# Patient Record
Sex: Female | Born: 1965 | Race: White | Hispanic: No | Marital: Married | Smoking: Never smoker
Health system: Southern US, Community
[De-identification: ages and names within clinical notes are randomized; demographics above are authoritative.]

## PROBLEM LIST (undated history)

## (undated) DIAGNOSIS — F32A Depression, unspecified: Secondary | ICD-10-CM

## (undated) DIAGNOSIS — N3010 Interstitial cystitis (chronic) without hematuria: Secondary | ICD-10-CM

## (undated) DIAGNOSIS — F419 Anxiety disorder, unspecified: Secondary | ICD-10-CM

## (undated) DIAGNOSIS — E785 Hyperlipidemia, unspecified: Secondary | ICD-10-CM

## (undated) DIAGNOSIS — I447 Left bundle-branch block, unspecified: Secondary | ICD-10-CM

## (undated) DIAGNOSIS — S86011A Strain of right Achilles tendon, initial encounter: Secondary | ICD-10-CM

## (undated) DIAGNOSIS — T4145XA Adverse effect of unspecified anesthetic, initial encounter: Secondary | ICD-10-CM

## (undated) DIAGNOSIS — F329 Major depressive disorder, single episode, unspecified: Secondary | ICD-10-CM

## (undated) DIAGNOSIS — T8859XA Other complications of anesthesia, initial encounter: Secondary | ICD-10-CM

## (undated) DIAGNOSIS — T7840XA Allergy, unspecified, initial encounter: Secondary | ICD-10-CM

## (undated) HISTORY — DX: Hyperlipidemia, unspecified: E78.5

## (undated) HISTORY — DX: Allergy, unspecified, initial encounter: T78.40XA

## (undated) HISTORY — DX: Interstitial cystitis (chronic) without hematuria: N30.10

## (undated) HISTORY — PX: TUBAL LIGATION: SHX77

## (undated) HISTORY — PX: BUNIONECTOMY: SHX129

## (undated) HISTORY — DX: Major depressive disorder, single episode, unspecified: F32.9

## (undated) HISTORY — DX: Depression, unspecified: F32.A

## (undated) HISTORY — PX: BREAST SURGERY: SHX581

---

## 2001-10-06 ENCOUNTER — Ambulatory Visit (HOSPITAL_COMMUNITY): Admission: RE | Admit: 2001-10-06 | Discharge: 2001-10-06 | Payer: Self-pay | Admitting: Obstetrics and Gynecology

## 2003-11-13 ENCOUNTER — Other Ambulatory Visit: Admission: RE | Admit: 2003-11-13 | Discharge: 2003-11-13 | Payer: Self-pay | Admitting: Obstetrics and Gynecology

## 2005-06-09 ENCOUNTER — Other Ambulatory Visit: Admission: RE | Admit: 2005-06-09 | Discharge: 2005-06-09 | Payer: Self-pay | Admitting: Obstetrics and Gynecology

## 2006-05-29 ENCOUNTER — Inpatient Hospital Stay (HOSPITAL_COMMUNITY): Admission: EM | Admit: 2006-05-29 | Discharge: 2006-05-30 | Payer: Self-pay | Admitting: Emergency Medicine

## 2006-09-21 ENCOUNTER — Ambulatory Visit (HOSPITAL_COMMUNITY): Admission: RE | Admit: 2006-09-21 | Discharge: 2006-09-21 | Payer: Self-pay | Admitting: Family Medicine

## 2010-06-18 ENCOUNTER — Ambulatory Visit
Admission: RE | Admit: 2010-06-18 | Discharge: 2010-06-18 | Payer: Self-pay | Source: Home / Self Care | Attending: Family Medicine | Admitting: Family Medicine

## 2010-06-19 ENCOUNTER — Ambulatory Visit (HOSPITAL_COMMUNITY)
Admission: RE | Admit: 2010-06-19 | Discharge: 2010-06-19 | Payer: Self-pay | Source: Home / Self Care | Attending: Family Medicine | Admitting: Family Medicine

## 2010-07-14 ENCOUNTER — Ambulatory Visit: Admit: 2010-07-14 | Payer: Self-pay | Admitting: Internal Medicine

## 2010-08-18 ENCOUNTER — Institutional Professional Consult (permissible substitution): Payer: Self-pay | Admitting: Internal Medicine

## 2010-11-13 NOTE — Op Note (Signed)
[facility]  Patient:    DLISA, BARNWELL Visit Number: [id number] MRN: [id number]          Service Type: DSU Location: [facility] Attending Physician:  Frederich Balding Dictated by:   Juluis Mire, M.D. Proc. Date: 10/06/01 Admit Date:  10/06/2001 Discharge Date: 10/06/2001                             Operative Report  PREOPERATIVE DIAGNOSES:       Multiparity, desires sterility.  POSTOPERATIVE DIAGNOSES:      Multiparity, desires sterility.  OPERATIVE PROCEDURE:          Laparoscopic bilateral tubal fulguration.  SURGEON:                      Juluis Mire, M.D.  ANESTHESIA:                   General.  ESTIMATED BLOOD LOSS:         Minimal.  PACKS AND DRAINS:             None.  INTRAOPERATIVE BLOOD PLACED:  None.  COMPLICATIONS:                None.  INDICATIONS:                  As dictated in the history and physical.  PROCEDURE AS FOLLOWS:         Patient taken to the OR and placed in supine position.  After a satisfactory level of general endotracheal anesthesia was obtained patient was placed in the dorsal lithotomy position using the Allen stirrups.  The abdomen, perineum, and vagina were prepped out with Betadine. Bladder was emptied by in-and-out catheterization.  A Hulka tenaculum was put in place and secured.  The patient was draped out for laparoscopy.  A subumbilical incision was made with the knife.  Veress needle was introduced in the abdominal cavity.  Abdomen was inflated with approximately 3 L of carbon dioxide.  Operating laparoscope was introduced.  There was no evidence of injury to adjacent organs.  Uterus was normal size and shape.  Tubes and ovaries were unremarkable.  There were no signs of endometriosis or adhesive processes.  The appendix was visualized and noted to be normal.  [place] abdomen including liver and tip of the gallbladder were clear.  Each tube was adequately identified.  Using the bipolar a 2 cm  segment of each tube was coagulated.  Coagulation was continued until resistance read 0 on the meter. Same segment of tube was then recoagulated completely desiccating the tube. Coagulation did extend out to the mesosalpinx.  At the end of the procedure both tubes were adequately coagulated.  There were no signs of injury to adjacent organs.  The abdomen was deflated of its carbon dioxide and the laparoscope was removed.  Subumbilical incision was closed with interrupted subcuticulars of 4-0 Vicryl.  The Hulka tenaculum was then removed.  Patient taken out of the dorsal lithotomy position and once extubated transferred to recovery room in good condition.  Sponge, instrument, needle count reported as correct by circulating nurse x2. Dictated by:   Juluis Mire, M.D. Attending Physician:  Frederich Balding DD:  10/06/01 TD:  10/06/01 Job: [id number]

## 2010-11-13 NOTE — H&P (Signed)
[facility]  Patient:    Deanna Simpson, Deanna Simpson Visit Number: [id number] MRN: [id number]          Service Type: DSU Location: [facility] Attending Physician:  Frederich Balding Dictated by:   Juluis Mire, M.D. Admit Date:  10/06/2001 Discharge Date: 10/06/2001                           History and Physical  CHIEF COMPLAINT:              The patient is a 45 year old gravida 4 para 3 abortus 1 married white female, who presents for permanent sterilization in the form of bilateral tubal ligation.  HISTORY OF PRESENT ILLNESS:   Alternative forms of birth control have been discussed.  The potential irreversibility of sterilization was explained.  A failure rate of 1:200 was quoted.  Pregnancy can be in the form of ectopic pregnancy requiring further surgical management.  The patient expressed understanding of the indications and risks.  ALLERGIES:                    1. ANTIHISTAMINES.                               2. CLARITIN.  MEDICATIONS:                  None.  PAST MEDICAL HISTORY:         Usual childhood diseases, no significant sequelae.  PAST SURGICAL HISTORY:        Only surgical history is a bunionectomy.  PAST OBSTETRICAL HISTORY:     1. She has had three spontaneous vaginal                                  deliveries.                               2. One spontaneous abortion.  FAMILY HISTORY:               Noncontributory.  SOCIAL HISTORY:               No tobacco or alcohol use.  REVIEW OF SYSTEMS:            Noncontributory.  PHYSICAL EXAMINATION:  VITAL SIGNS:                  Afebrile, stable vital signs.  HEENT:                        Normocephalic.  PERRLA.  EOMI.  Sclerae and conjunctivae clear.  Oropharynx clear.  NECK:                         Without thyromegaly.  BREAST:                       Not examined.  LUNGS:                        Clear.  CARDIAC:                      Regular rhythm and rate without  murmurs  or gallops.  ABDOMEN:                      Benign.  PELVIC:                       Normal external genitalia.  Vaginal mucosa clear.  Cervix unremarkable.  Uterus normal size, shape, and contour.  Adnexa free of masses or tenderness.  EXTREMITIES:                  Trace edema.  NEUROLOGIC:                   Grossly within normal limits.  IMPRESSION:                   Multiparity, desires sterility.  PLAN:                         The patient will undergo laparoscopic bilateral tubal ligation.  The risks of surgery have been discussed including the risk of infection, the risk of vascular injury that could lead to hemorrhage requiring transfusion and possible exploratory surgery, risk of injury to adjacent organs such as bowel or bladder that could require further exploratory surgery, the risk of deep vein thrombosis and pulmonary embolus. The patient expressed understanding of indications and risks and is accepting of them. Dictated by:   Juluis Mire, M.D. Attending Physician:  Frederich Balding DD:  10/06/01 TD:  10/06/01 Job: [id number]
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
## 2011-06-03 ENCOUNTER — Ambulatory Visit (INDEPENDENT_AMBULATORY_CARE_PROVIDER_SITE_OTHER): Payer: BC Managed Care – PPO | Admitting: Family Medicine

## 2011-06-03 DIAGNOSIS — IMO0001 Reserved for inherently not codable concepts without codable children: Secondary | ICD-10-CM

## 2011-06-03 DIAGNOSIS — R5381 Other malaise: Secondary | ICD-10-CM

## 2011-06-03 DIAGNOSIS — G4700 Insomnia, unspecified: Secondary | ICD-10-CM

## 2011-08-10 ENCOUNTER — Ambulatory Visit (INDEPENDENT_AMBULATORY_CARE_PROVIDER_SITE_OTHER): Payer: BC Managed Care – PPO | Admitting: Family Medicine

## 2011-08-10 ENCOUNTER — Encounter: Payer: Self-pay | Admitting: Family Medicine

## 2011-08-10 ENCOUNTER — Ambulatory Visit: Payer: BC Managed Care – PPO

## 2011-08-10 VITALS — BP 126/81 | HR 76 | Temp 98.3°F | Resp 16 | Ht 63.75 in | Wt 205.0 lb

## 2011-08-10 DIAGNOSIS — M797 Fibromyalgia: Secondary | ICD-10-CM | POA: Insufficient documentation

## 2011-08-10 DIAGNOSIS — M25552 Pain in left hip: Secondary | ICD-10-CM

## 2011-08-10 DIAGNOSIS — M25559 Pain in unspecified hip: Secondary | ICD-10-CM

## 2011-08-10 DIAGNOSIS — F329 Major depressive disorder, single episode, unspecified: Secondary | ICD-10-CM | POA: Insufficient documentation

## 2011-08-10 DIAGNOSIS — F32A Depression, unspecified: Secondary | ICD-10-CM | POA: Insufficient documentation

## 2011-08-10 DIAGNOSIS — E785 Hyperlipidemia, unspecified: Secondary | ICD-10-CM | POA: Insufficient documentation

## 2011-08-10 DIAGNOSIS — M25551 Pain in right hip: Secondary | ICD-10-CM

## 2011-08-10 DIAGNOSIS — E669 Obesity, unspecified: Secondary | ICD-10-CM

## 2011-08-10 MED ORDER — METAXALONE 800 MG PO TABS: 800.0000 mg | ORAL_TABLET | Freq: Three times a day (TID) | ORAL | Status: AC

## 2011-08-10 NOTE — Progress Notes (Signed)
  Subjective:    Patient ID: Deanna Simpson, female    DOB: [birth year], 46 y.o.   MRN: [id number]  HPI   This 46 y.o. Cauc female was seen 06/03/11 for evaluation of fatigue and pain of 6 months duration.  Pain was located around her deltoids and shoulders as well as proximal and inner thighs and buttocks.  At that time, her exam was consistent with Fibromyalgia as she had multiple triggers points.Her other  symptoms which included insomnia and fatigue could be attributed to Lamictal which she takes for  Depression (side effect profile includes GI upset, Insomnia, dizziness, back pain and fatigue).  She was referred to PT when xrays showed no significant cause for the pain; C-spine film revealed loss of  lordosis. She was also advised  that a Sleep Study may be helpful.      She states today that she is much better after Physical therapy especially through the back area;  however, she still has a lot of pain and tightness in her hip flexors and this makes intimacy with he   husband difficult. Her therapist did try some treatments aimed at loosening the flexors and she says this   was helpful.       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  Review of Systems  Positive for tightness in the groin area bilaterally.                                   Negative for change in GU/GI function. No Lumbar spine pain.     Objective:   Physical Exam  Vitals reviewed. Constitutional: She is oriented to person, place, and time. She appears well-developed and well-nourished. No distress.  HENT:  Head: Normocephalic and atraumatic.  Neck: Normal range of motion. Neck supple.  Cardiovascular: Normal rate.   Pulmonary/Chest: Effort normal.  Abdominal: Soft. Bowel sounds are normal. She exhibits no distension and no mass. There is no tenderness. There is no guarding.  Musculoskeletal: She exhibits no tenderness.       Bilateral Hip Flexors: decreased ROM with abduction  limited to 30 degrees.  Neurological: She is alert and oriented to person, place, and time.  Skin: Skin is warm and dry.  Psychiatric: She has a normal mood and affect. Her behavior is normal.    [facility] reading (PRIMARY) by  Dr Audria Nine: Normal pelvic/ hip bones without fracture or dislocation.       Assessment & Plan:   1. Hip pain, bilateral  DG Hip Bilateral W/Pelvis- Negative. Trial Skelaxin 800 mg 1/2 - 1 tablet 3x daily prn Continue with Physical Therapy for hip flexor release. Pt has done Yoga in the past and return to this practice could be helpful.  2. Muscle pain, fibromyalgia  Conitnue Aleve prn along with Skelaxin.  3. Obesity  Pt has lost > 10 lbs; encouraged continued wt reduction.

## 2011-08-10 NOTE — Patient Instructions (Signed)
Hip Pain The hips join the [place] legs to the lower pelvis. The bones, cartilage, tendons, and muscles of the hip joint perform a lot of work each day holding your body weight and allowing you to move around. Hip pain is a common symptom. It can range from a minor ache to severe pain on 1 or both hips. Pain may be felt on the inside of the hip joint near the groin, or the outside near the buttocks and [place] thigh. There may be swelling or stiffness as well. It occurs more often when a person walks or performs activity. There are many reasons hip pain can develop. CAUSES  It is important to work with your caregiver to identify the cause since many conditions can impact the bones, cartilage, muscles, and tendons of the hips. Causes for hip pain include:  Broken (fractured) bones.   Separation of the thighbone from the hip socket (dislocation).   Torn cartilage of the hip joint.   Swelling (inflammation) of a tendon (tendonitis), the sac within the hip joint (bursitis), or a joint.   A weakening in the abdominal wall (hernia), affecting the nerves to the hip.   Arthritis in the hip joint or lining of the hip joint.   Pinched nerves in the back, hip, or [place] thigh.   A bulging disc in the spine (herniated disc).   Rarely, bone infection or cancer.  DIAGNOSIS  The location of your hip pain will help your caregiver understand what may be causing the pain. A diagnosis is based on your medical history, your symptoms, results from your physical exam, and results from diagnostic tests. Diagnostic tests may include X-ray exams, a computerized magnetic scan (magnetic resonance imaging, MRI), or bone scan. TREATMENT  Treatment will depend on the cause of your hip pain. Treatment may include:  Limiting activities and resting until symptoms improve.   Crutches or other walking supports (a cane or brace).   Ice, elevation, and compression.   Physical therapy or home exercises.   Shoe inserts or  special shoes.   Losing weight.   Medications to reduce pain.   Undergoing surgery.  HOME CARE INSTRUCTIONS   Only take over-the-counter or prescription medicines for pain, discomfort, or fever as directed by your caregiver.   Put ice on the injured area:   Put ice in a plastic bag.   Place a towel between your skin and the bag.   Leave the ice on for 15 to 20 minutes at a time, 3 to 4 times a day.   Keep your leg raised (elevated) when possible to lessen swelling.   Avoid activities that cause pain.   Follow specific exercises as directed by your caregiver.   Sleep with a pillow between your legs on your most comfortable side.   Record how often you have hip pain, the location of the pain, and what it feels like. This information may be helpful to you and your caregiver.   Ask your caregiver about returning to work or sports and whether you should drive.   Follow up with your caregiver for further exams, therapy, or testing as directed.  SEEK MEDICAL CARE IF:   Your pain or swelling continues or worsens after 1 week.   You are feeling unwell or have chills.   You have increasing difficulty with walking.   You have a loss of sensation or other new symptoms.   You have questions or concerns.  SEEK IMMEDIATE MEDICAL CARE IF:   You   cannot put weight on the affected hip.   You have fallen.   You have a sudden increase in pain and swelling in your hip.   You have a fever.  MAKE SURE YOU:   Understand these instructions.   Will watch your condition.   Will get help right away if you are not doing well or get worse.  Document Released: 12/02/2009 Document Revised: 02/24/2011 Document Reviewed: 12/02/2009 ExitCare Patient Information 2012 [employer]. 

## 2012-08-28 ENCOUNTER — Telehealth: Payer: Self-pay

## 2012-08-28 ENCOUNTER — Ambulatory Visit: Payer: BC Managed Care – PPO

## 2012-08-28 ENCOUNTER — Ambulatory Visit: Payer: BC Managed Care – PPO | Admitting: Emergency Medicine

## 2012-08-28 VITALS — BP 124/82 | HR 87 | Temp 97.9°F | Resp 16 | Ht 63.5 in | Wt 215.0 lb

## 2012-08-28 DIAGNOSIS — R110 Nausea: Secondary | ICD-10-CM

## 2012-08-28 DIAGNOSIS — R109 Unspecified abdominal pain: Secondary | ICD-10-CM

## 2012-08-28 DIAGNOSIS — R1084 Generalized abdominal pain: Secondary | ICD-10-CM

## 2012-08-28 DIAGNOSIS — K2990 Gastroduodenitis, unspecified, without bleeding: Secondary | ICD-10-CM

## 2012-08-28 DIAGNOSIS — K2970 Gastritis, unspecified, without bleeding: Secondary | ICD-10-CM

## 2012-08-28 LAB — POCT URINALYSIS DIPSTICK
Glucose, UA: NEGATIVE
Protein, UA: NEGATIVE
Urobilinogen, UA: 0.2
pH, UA: 5.5

## 2012-08-28 LAB — POCT CBC
Granulocyte percent: 73.2 %G (ref 37–80)
Lymph, poc: 1.3 (ref 0.6–3.4)
MCHC: 31 g/dL — AB (ref 31.8–35.4)
MID (cbc): 0.4 (ref 0–0.9)
POC Granulocyte: 4.8 (ref 2–6.9)
POC MID %: 6.6 %M (ref 0–12)
WBC: 6.6 10*3/uL (ref 4.6–10.2)

## 2012-08-28 LAB — POCT UA - MICROSCOPIC ONLY
Casts, Ur, LPF, POC: NEGATIVE
Crystals, Ur, HPF, POC: NEGATIVE

## 2012-08-28 LAB — COMPREHENSIVE METABOLIC PANEL
AST: 13 U/L (ref 0–37)
BUN: 11 mg/dL (ref 6–23)
Calcium: 9.7 mg/dL (ref 8.4–10.5)
Creat: 0.79 mg/dL (ref 0.50–1.10)
Glucose, Bld: 98 mg/dL (ref 70–99)
Potassium: 4.4 mEq/L (ref 3.5–5.3)
Sodium: 141 mEq/L (ref 135–145)
Total Bilirubin: 0.3 mg/dL (ref 0.3–1.2)

## 2012-08-28 LAB — LIPASE: Lipase: 13 U/L (ref 0–75)

## 2012-08-28 NOTE — Telephone Encounter (Signed)
PT WOULD LIKE TO SPEAK WITH A NURSE REGARDING PAIN IN HER STOMACH AS THOUGH SHE MAY HAVE GALL STONES. I ADVISED PT TO COME IN AND BE SEEN. STATES IF SHE HASN'T HAD A CALL BY 2:00, SHE MAY JUST COME IN. PLEASE CALL [phone]

## 2012-08-28 NOTE — Patient Instructions (Addendum)
Gastritis, Adult Gastritis is soreness and swelling (inflammation) of the lining of the stomach. Gastritis can develop as a sudden onset (acute) or long-term (chronic) condition. If gastritis is not treated, it can lead to stomach bleeding and ulcers. CAUSES  Gastritis occurs when the stomach lining is weak or damaged. Digestive juices from the stomach then inflame the weakened stomach lining. The stomach lining may be weak or damaged due to viral or bacterial infections. One common bacterial infection is the Helicobacter pylori infection. Gastritis can also result from excessive alcohol consumption, taking certain medicines, or having too much acid in the stomach.  SYMPTOMS  In some cases, there are no symptoms. When symptoms are present, they may include:  Pain or a burning sensation in the [place] abdomen.  Nausea.  Vomiting.  An uncomfortable feeling of fullness after eating. DIAGNOSIS  Your caregiver may suspect you have gastritis based on your symptoms and a physical exam. To determine the cause of your gastritis, your caregiver may perform the following:  Blood or stool tests to check for the H pylori bacterium.  Gastroscopy. A thin, flexible tube (endoscope) is passed down the esophagus and into the stomach. The endoscope has a light and camera on the end. Your caregiver uses the endoscope to view the inside of the stomach.  Taking a tissue sample (biopsy) from the stomach to examine under a microscope. TREATMENT  Depending on the cause of your gastritis, medicines may be prescribed. If you have a bacterial infection, such as an H pylori infection, antibiotics may be given. If your gastritis is caused by too much acid in the stomach, H2 blockers or antacids may be given. Your caregiver may recommend that you stop taking aspirin, ibuprofen, or other nonsteroidal anti-inflammatory drugs (NSAIDs). HOME CARE INSTRUCTIONS  Only take over-the-counter or prescription medicines as directed by  your caregiver.  If you were given antibiotic medicines, take them as directed. Finish them even if you start to feel better.  Drink enough fluids to keep your urine clear or pale yellow.  Avoid foods and drinks that make your symptoms worse, such as:  Caffeine or alcoholic drinks.  Chocolate.  Peppermint or mint flavorings.  Garlic and onions.  Spicy foods.  Citrus fruits, such as oranges, lemons, or limes.  Tomato-based foods such as sauce, chili, salsa, and pizza.  Fried and fatty foods.  Eat small, frequent meals instead of large meals. SEEK IMMEDIATE MEDICAL CARE IF:   You have black or dark red stools.  You vomit blood or material that looks like coffee grounds.  You are unable to keep fluids down.  Your abdominal pain gets worse.  You have a fever.  You do not feel better after 1 week.  You have any other questions or concerns. MAKE SURE YOU:  Understand these instructions.  Will watch your condition.  Will get help right away if you are not doing well or get worse. Document Released: 06/08/2001 Document Revised: 12/14/2011 Document Reviewed: 07/28/2011 ExitCare Patient Information 2013 [employer].  

## 2012-08-28 NOTE — Progress Notes (Signed)
[facility] [address], [place] [address] [phone]  Date:  08/28/2012   Name:  Deanna Simpson   DOB:  05/07/1966   MRN:  [id number]  PCP:  No primary provider on file.    Chief Complaint: Abdominal Pain   History of Present Illness:  Deanna Simpson is a 47 y.o. very pleasant female patient who presents with the following:  Ill this morning after eating [place] food last night.  Non of her dining party are ill.  She developed sudden Tremain abdominal sharp pain around 0900 this morning.  No nausea or vomiting.  No distension or bloating.  No improvement with tums.  Denies frequent heart burn or indigestion.  No history of abdominal surgery.  No history of GERD.  No stool change.  No GI or GU or GYN symptoms.  No fever or chills. No improvement with over the counter medications or other home remedies. No excess caffeine, alcohol, ASA, NSAID.  Patient Active Problem List  Diagnosis  . Depression  . Muscle pain, fibromyalgia  . Hyperlipidemia  . Obesity    Past Medical History  Diagnosis Date  . Allergy     Past Surgical History  Procedure Laterality Date  . Breast surgery    . Tubal ligation      History  Substance Use Topics  . Smoking status: Never Smoker   . Smokeless tobacco: Not on file  . Alcohol Use: No    Family History  Problem Relation Age of Onset  . Heart disease Mother     Mitral valve prolapse  . Hyperlipidemia Father   . Heart disease Brother   . Heart disease Maternal Grandmother   . Heart disease Maternal Grandfather     Allergies  Allergen Reactions  . Antihistamines, Loratadine-Type     Heart races.  . Influenza Vaccine Live Swelling    Vaccine causes swelling and pain at injection site  . Prednisone Other (See Comments)    Prednisone makes pt Suicidal/ psychotic.    Medication list has been reviewed and updated.  Current Outpatient Prescriptions on File Prior to Visit  Medication Sig Dispense Refill  .  ARIPiprazole (ABILIFY) 2 MG tablet Take 2 mg by mouth daily.      Marland Kitchen lamoTRIgine (LAMICTAL) 200 MG tablet Take 200 mg by mouth daily.       No current facility-administered medications on file prior to visit.    Review of Systems:  As per HPI, otherwise negative.    Physical Examination: Filed Vitals:   08/28/12 1139  BP: 124/82  Pulse: 87  Temp: 97.9 F (36.6 C)  Resp: 16   Filed Vitals:   08/28/12 1139  Height: 5' 3.5" (1.613 m)  Weight: 215 lb (97.523 kg)   Body mass index is 37.48 kg/(m^2). Ideal Body Weight: Weight in (lb) to have BMI = 25: 143.1  GEN: WDWN, NAD, Non-toxic, A & O x 3 HEENT: Atraumatic, Normocephalic. Neck supple. No masses, No LAD. Ears and Nose: No external deformity. CV: RRR, No M/G/R. No JVD. No thrill. No extra heart sounds. PULM: CTA B, no wheezes, crackles, rhonchi. No retractions. No resp. distress. No accessory muscle use. ABD: S, generalized mild abdominal tenderness, ND, +BS. No rebound. No HSM. EXTR: No c/c/e NEURO Normal gait.  PSYCH: Normally interactive. Conversant. Not depressed or anxious appearing.  Calm demeanor.    Assessment and Plan: Gastritis Improved with GI cocktail. Symptoms relieved prilosec  Carmelina Dane, MD  Results for orders placed in visit on 08/28/12  POCT CBC      Result Value Range   WBC 6.6  4.6 - 10.2 K/uL   Lymph, poc 1.3  0.6 - 3.4   POC LYMPH PERCENT 20.2  10 - 50 %L   MID (cbc) 0.4  0 - 0.9   POC MID % 6.6  0 - 12 %M   POC Granulocyte 4.8  2 - 6.9   Granulocyte percent 73.2  37 - 80 %G   RBC 4.57  4.04 - 5.48 M/uL   Hemoglobin 12.6  12.2 - 16.2 g/dL   HCT, POC 16.1  09.6 - 47.9 %   MCV 89.1  80 - 97 fL   MCH, POC 27.6  27 - 31.2 pg   MCHC 31.0 (*) 31.8 - 35.4 g/dL   RDW, POC 04.5     Platelet Count, POC 278  142 - 424 K/uL   MPV 8.1  0 - 99.8 fL  POCT UA - MICROSCOPIC ONLY      Result Value Range   WBC, Ur, HPF, POC 2-3     RBC, urine, microscopic 0-2     Bacteria, U Microscopic  trace     Mucus, UA trace     Epithelial cells, urine per micros 1-3     Crystals, Ur, HPF, POC neg     Casts, Ur, LPF, POC neg     Yeast, UA neg    POCT URINALYSIS DIPSTICK      Result Value Range   Color, UA yellow     Clarity, UA clear     Glucose, UA neg     Bilirubin, UA neg     Ketones, UA neg     Spec Grav, UA 1.025     Blood, UA neg     pH, UA 5.5     Protein, UA neg     Urobilinogen, UA 0.2     Nitrite, UA neg     Leukocytes, UA Negative      [facility] reading (PRIMARY) by  Dr. Dareen Piano.  Negative for obstruction, SBE, free air.Marland Kitchen

## 2012-08-29 NOTE — Telephone Encounter (Signed)
She was here to be seen yesterday

## 2012-08-30 ENCOUNTER — Encounter: Payer: Self-pay | Admitting: *Deleted

## 2013-02-05 ENCOUNTER — Ambulatory Visit: Payer: BC Managed Care – PPO | Admitting: Family Medicine

## 2013-02-05 VITALS — BP 124/76 | HR 84 | Temp 98.0°F | Resp 17 | Ht 64.0 in | Wt 198.0 lb

## 2013-02-05 DIAGNOSIS — L237 Allergic contact dermatitis due to plants, except food: Secondary | ICD-10-CM

## 2013-02-05 DIAGNOSIS — L255 Unspecified contact dermatitis due to plants, except food: Secondary | ICD-10-CM

## 2013-02-05 MED ORDER — TRIAMCINOLONE ACETONIDE 0.1 % EX CREA: TOPICAL_CREAM | Freq: Three times a day (TID) | CUTANEOUS | Status: DC

## 2013-02-05 MED ORDER — METHYLPREDNISOLONE ACETATE 80 MG/ML IJ SUSP
80.0000 mg | Freq: Once | INTRAMUSCULAR | Status: AC
  Administered 2013-02-05: 80 mg via INTRAMUSCULAR

## 2013-02-05 NOTE — Progress Notes (Signed)
  Subjective:    Patient ID: Deanna Simpson, female    DOB: 1965-10-01, 47 y.o.   MRN: [id number] Chief Complaint  Patient presents with  . Poison Ivy    all over body     HPI   Was [place] last Tues and was in Systems analyst and sports bra. The following day she began itching.  She has been using a rx topical hemorroid cream for the itch as well as calamine but continues to be very itchy and getting worse.  No poison ivy on face, eyes, mouth, or genital area.  Past Medical History  Diagnosis Date  . Allergy   . Depression   . Complication of anesthesia     Histerics   Current Outpatient Prescriptions on File Prior to Visit  Medication Sig Dispense Refill  . cholecalciferol (VITAMIN D) 1000 UNITS tablet Take 1,000 Units by mouth daily.      Marland Kitchen lamoTRIgine (LAMICTAL) 200 MG tablet Take 200 mg by mouth daily.       No current facility-administered medications on file prior to visit.   Allergies  Allergen Reactions  . Antihistamines, Loratadine-Type     Heart races.  . Influenza Vaccine Live Swelling    Vaccine causes swelling and pain at injection site  . Pneumococcal Vaccines Swelling    Causes swelling and pain at injection site.  . Prednisone Other (See Comments)    Prednisone makes pt Suicidal/ psychotic.    Review of Systems  Constitutional: Negative for fever, chills and diaphoresis.  Musculoskeletal: Negative for arthralgias and joint swelling.  Skin: Positive for color change and rash. Negative for pallor and wound.  Hematological: Negative for adenopathy. Does not bruise/bleed easily.  Psychiatric/Behavioral: Positive for sleep disturbance.      BP 124/76  Pulse 84  Temp(Src) 98 F (36.7 C) (Oral)  Resp 17  Ht 5\' 4"  (1.626 m)  Wt 198 lb (89.812 kg)  BMI 33.97 kg/m2  SpO2 98% Objective:   Physical Exam  Constitutional: She is oriented to person, place, and time. She appears well-developed and well-nourished. No distress.  HENT:  Head:  Normocephalic and atraumatic.  Right Ear: External ear normal.  Eyes: Conjunctivae are normal. No scleral icterus.  Pulmonary/Chest: Effort normal.  Neurological: She is alert and oriented to person, place, and time.  Skin: Skin is warm and dry. Rash noted. Rash is maculopapular and vesicular. She is not diaphoretic.  Psychiatric: She has a normal mood and affect. Her behavior is normal.          Assessment & Plan:   Poison ivy dermatitis - Plan: methylPREDNISolone acetate (DEPO-MEDROL) injection 80 mg  Meds ordered this encounter  Medications  . methylPREDNISolone acetate (DEPO-MEDROL) injection 80 mg    Sig:   . triamcinolone cream (KENALOG) 0.1 %    Sig: Apply topically 3 (three) times daily.    Dispense:  85.2 g    Refill:  1    Norberto Sorenson, MD MPH

## 2013-02-05 NOTE — Patient Instructions (Addendum)
Poison Ivy Poison ivy is a inflammation of the skin (contact dermatitis) caused by touching the allergens on the leaves of the ivy plant following previous exposure to the plant. The rash usually appears 48 hours after exposure. The rash is usually bumps (papules) or blisters (vesicles) in a linear pattern. Depending on your own sensitivity, the rash may simply cause redness and itching, or it may also progress to blisters which may break open. These must be well cared for to prevent secondary bacterial (germ) infection, followed by scarring. Keep any open areas dry, clean, dressed, and covered with an antibacterial ointment if needed. The eyes may also get puffy. The puffiness is worst in the morning and gets better as the day progresses. This dermatitis usually heals without scarring, within 2 to 3 weeks without treatment. HOME CARE INSTRUCTIONS  Thoroughly wash with soap and water as soon as you have been exposed to poison ivy. You have about one half hour to remove the plant resin before it will cause the rash. This washing will destroy the oil or antigen on the skin that is causing, or will cause, the rash. Be sure to wash under your fingernails as any plant resin there will continue to spread the rash. Do not rub skin vigorously when washing affected area. Poison ivy cannot spread if no oil from the plant remains on your body. A rash that has progressed to weeping sores will not spread the rash unless you have not washed thoroughly. It is also important to wash any clothes you have been wearing as these may carry active allergens. The rash will return if you wear the unwashed clothing, even several days later. Avoidance of the plant in the future is the best measure. Poison ivy plant can be recognized by the number of leaves. Generally, poison ivy has three leaves with flowering branches on a single stem. Diphenhydramine may be purchased over the counter and used as needed for itching. Do not drive with  this medication if it makes you drowsy.Ask your caregiver about medication for children. SEEK MEDICAL CARE IF:  Open sores develop.  Redness spreads beyond area of rash.  You notice purulent (pus-like) discharge.  You have increased pain.  Other signs of infection develop (such as fever). Document Released: 06/11/2000 Document Revised: 09/06/2011 Document Reviewed: 04/30/2009 Exit[employer] Patient Information 2014 [employer].  

## 2013-05-20 ENCOUNTER — Other Ambulatory Visit: Payer: Self-pay | Admitting: Family Medicine

## 2013-05-20 ENCOUNTER — Ambulatory Visit: Payer: BC Managed Care – PPO

## 2013-05-20 ENCOUNTER — Ambulatory Visit: Payer: BC Managed Care – PPO | Admitting: Family Medicine

## 2013-05-20 VITALS — BP 122/78 | HR 100 | Temp 98.6°F | Resp 17 | Ht 64.0 in | Wt 198.0 lb

## 2013-05-20 DIAGNOSIS — M25571 Pain in right ankle and joints of right foot: Secondary | ICD-10-CM

## 2013-05-20 DIAGNOSIS — M25579 Pain in unspecified ankle and joints of unspecified foot: Secondary | ICD-10-CM

## 2013-05-20 NOTE — Progress Notes (Addendum)
Subjective:    Patient ID: Deanna Simpson, female    DOB: 09/10/1965, 47 y.o.   MRN: [id number] This chart was scribed for Meredith Staggers, MD by Valera Castle, ED Scribe. This patient was seen in room 1 and the patient's care was started at 10:16 AM.  HPI Deanna Simpson is a 47 y.o. female who presents to the [facility] with sudden, mild, constant, right foot pain, onset yesterday when she noticed she was unable to walk in closed shoes. She reports jamming her foot two weeks ago while losing her shoe while running, and thinks maybe the pain is coming around just now. She reports being ambulatory, and denies any bruising at the initial incident. She denies any other recent injury. She denies h/o injury to that ankle. She denies right knee pain, right ankle pian, current numbness and tingling to her right foot, and any other associated symptoms. She reports being in the [employer] play.    PCP - Juluis Mire, MD  Patient Active Problem List   Diagnosis Date Noted  . Depression 08/10/2011  . Muscle pain, fibromyalgia 08/10/2011  . Hyperlipidemia 08/10/2011  . Obesity 08/10/2011   Past Medical History  Diagnosis Date  . Allergy   . Depression    Past Surgical History  Procedure Laterality Date  . Breast surgery    . Tubal ligation     Allergies  Allergen Reactions  . Antihistamines, Loratadine-Type     Heart races.  . Influenza Vaccine Live Swelling    Vaccine causes swelling and pain at injection site  . Prednisone Other (See Comments)    Prednisone makes pt Suicidal/ psychotic.   Prior to Admission medications   Medication Sig Start Date End Date Taking? Authorizing Provider  ARIPiprazole (ABILIFY) 2 MG tablet Take 2 mg by mouth daily.   Yes Historical Provider, MD  cholecalciferol (VITAMIN D) 1000 UNITS tablet Take 1,000 Units by mouth daily.   Yes Historical Provider, MD  lamoTRIgine (LAMICTAL) 200 MG tablet Take 200 mg by mouth daily.   Yes Historical Provider, MD   triamcinolone cream (KENALOG) 0.1 % Apply topically 3 (three) times daily. 02/05/13   Sherren Mocha, MD   Results for orders placed in visit on 08/28/12  COMPREHENSIVE METABOLIC PANEL      Result Value Range   Sodium 141  135 - 145 mEq/L   Potassium 4.4  3.5 - 5.3 mEq/L   Chloride 105  96 - 112 mEq/L   CO2 28  19 - 32 mEq/L   Glucose, Bld 98  70 - 99 mg/dL   BUN 11  6 - 23 mg/dL   Creat 5.62  1.30 - 8.65 mg/dL   Total Bilirubin 0.3  0.3 - 1.2 mg/dL   Alkaline Phosphatase 53  39 - 117 U/L   AST 13  0 - 37 U/L   ALT 12  0 - 35 U/L   Total Protein 6.9  6.0 - 8.3 g/dL   Albumin 4.5  3.5 - 5.2 g/dL   Calcium 9.7  8.4 - 78.4 mg/dL  AMYLASE      Result Value Range   Amylase 30  0 - 105 U/L  LIPASE      Result Value Range   Lipase 13  0 - 75 U/L  POCT CBC      Result Value Range   WBC 6.6  4.6 - 10.2 K/uL   Lymph, poc 1.3  0.6 - 3.4   POC  LYMPH PERCENT 20.2  10 - 50 %L   MID (cbc) 0.4  0 - 0.9   POC MID % 6.6  0 - 12 %M   POC Granulocyte 4.8  2 - 6.9   Granulocyte percent 73.2  37 - 80 %G   RBC 4.57  4.04 - 5.48 M/uL   Hemoglobin 12.6  12.2 - 16.2 g/dL   HCT, POC 40.9  81.1 - 47.9 %   MCV 89.1  80 - 97 fL   MCH, POC 27.6  27 - 31.2 pg   MCHC 31.0 (*) 31.8 - 35.4 g/dL   RDW, POC 91.4     Platelet Count, POC 278  142 - 424 K/uL   MPV 8.1  0 - 99.8 fL  POCT UA - MICROSCOPIC ONLY      Result Value Range   WBC, Ur, HPF, POC 2-3     RBC, urine, microscopic 0-2     Bacteria, U Microscopic trace     Mucus, UA trace     Epithelial cells, urine per micros 1-3     Crystals, Ur, HPF, POC neg     Casts, Ur, LPF, POC neg     Yeast, UA neg    POCT URINALYSIS DIPSTICK      Result Value Range   Color, UA yellow     Clarity, UA clear     Glucose, UA neg     Bilirubin, UA neg     Ketones, UA neg     Spec Grav, UA 1.025     Blood, UA neg     pH, UA 5.5     Protein, UA neg     Urobilinogen, UA 0.2     Nitrite, UA neg     Leukocytes, UA Negative       Review of Systems   Musculoskeletal: Positive for arthralgias (right foot). Negative for myalgias.  Neurological: Negative for numbness.      Objective:   Physical Exam  Nursing note and vitals reviewed. Constitutional: She is oriented to person, place, and time. She appears well-developed and well-nourished. No distress.  HENT:  Head: Normocephalic and atraumatic.  Eyes: EOM are normal.  Neck: Neck supple. No tracheal deviation present.  Cardiovascular: Normal rate.   Pulmonary/Chest: Effort normal. No respiratory distress.  Musculoskeletal: Normal range of motion.  Full ROM non tender over right ankle. No bony tenderness. 5th metatarsal and navicula nontender. Tenderness along shaft of 4th metatarsal.    Neurological: She is alert and oriented to person, place, and time. She has normal reflexes. No cranial nerve deficit. Coordination normal.  Skin: Skin is warm and dry.  Skin intact around right ankle.   Psychiatric: She has a normal mood and affect. Her behavior is normal.    BP 122/78  Pulse 100  Temp(Src) 98.6 F (37 C) (Oral)  Resp 17  Ht 5\' 4"  (1.626 m)  Wt 198 lb (89.812 kg)  BMI 33.97 kg/m2  SpO2 98%  LMP 05/20/2013  [facility] reading (PRIMARY) by  Dr. Neva Seat: R foot: No apparent fracture or periosteal rxn at 4th mt.       Assessment & Plan:   Deanna Simpson is a 47 y.o. female Pain in joint, ankle and foot, right - Plan: DG Foot Complete Right, CANCELED: DG Foot 2 Views Right  R foot pain at 4th mt area, without sts, and no apparent fracture or periosteal rxn seen, but after today's performance - recommended postop shoe and decreased activity  for next 2 weeks, then if still painful with WB, consider stress injury and further imaging. Sooner if worse. Sx care with tylenol ok, and if improved in few weeks - wide toe box shoe may be helpful.   No orders of the defined types were placed in this encounter.   Patient Instructions  Try post op shoe for next 2 weeks, relative rest, over the  counter tylenol or advil if needed.  If not improved after 2 weeks, or worse sooner - return for recheck.        I personally performed the services described in this documentation, which was scribed in my presence. The recorded information has been reviewed and considered, and addended by me as needed.

## 2013-05-20 NOTE — Patient Instructions (Signed)
Try post op shoe for next 2 weeks, relative rest, over the counter tylenol or advil if needed.  If not improved after 2 weeks, or worse sooner - return for recheck.

## 2013-07-20 ENCOUNTER — Inpatient Hospital Stay (HOSPITAL_COMMUNITY)
Admission: AD | Admit: 2013-07-20 | Discharge: 2013-07-21 | Disposition: A | Discharge status: Home / Self Care | Payer: BC Managed Care – PPO | Source: Ambulatory Visit | Attending: Obstetrics and Gynecology | Admitting: Obstetrics and Gynecology

## 2013-07-20 ENCOUNTER — Encounter (HOSPITAL_COMMUNITY): Payer: Self-pay | Admitting: *Deleted

## 2013-07-20 DIAGNOSIS — R109 Unspecified abdominal pain: Secondary | ICD-10-CM | POA: Insufficient documentation

## 2013-07-20 DIAGNOSIS — R141 Gas pain: Secondary | ICD-10-CM | POA: Insufficient documentation

## 2013-07-20 DIAGNOSIS — R142 Eructation: Secondary | ICD-10-CM | POA: Insufficient documentation

## 2013-07-20 DIAGNOSIS — R143 Flatulence: Secondary | ICD-10-CM

## 2013-07-20 DIAGNOSIS — M549 Dorsalgia, unspecified: Secondary | ICD-10-CM | POA: Insufficient documentation

## 2013-07-20 HISTORY — DX: Other complications of anesthesia, initial encounter: T88.59XA

## 2013-07-20 HISTORY — DX: Adverse effect of unspecified anesthetic, initial encounter: T41.45XA

## 2013-07-20 LAB — COMPREHENSIVE METABOLIC PANEL
ALT: 11 U/L (ref 0–35)
AST: 13 U/L (ref 0–37)
Albumin: 3.7 g/dL (ref 3.5–5.2)
Alkaline Phosphatase: 52 U/L (ref 39–117)
BUN: 12 mg/dL (ref 6–23)
CHLORIDE: 101 meq/L (ref 96–112)
CO2: 25 meq/L (ref 19–32)
CREATININE: 0.81 mg/dL (ref 0.50–1.10)
Calcium: 9.2 mg/dL (ref 8.4–10.5)
GFR, EST NON AFRICAN AMERICAN: 86 mL/min — AB (ref >90)
Glucose, Bld: 103 mg/dL — ABNORMAL HIGH (ref 70–99)
POTASSIUM: 3.9 meq/L (ref 3.7–5.3)
SODIUM: 140 meq/L (ref 137–147)
Total Bilirubin: <0.2 mg/dL — ABNORMAL LOW (ref 0.3–1.2)
Total Protein: 6.5 g/dL (ref 6.0–8.3)

## 2013-07-20 LAB — CBC WITH DIFFERENTIAL/PLATELET
BASOS ABS: 0 10*3/uL (ref 0.0–0.1)
BASOS PCT: 1 % (ref 0–1)
EOS ABS: 0.2 10*3/uL (ref 0.0–0.7)
EOS PCT: 3 % (ref 0–5)
HEMATOCRIT: 35.9 % — AB (ref 36.0–46.0)
HEMOGLOBIN: 12.3 g/dL (ref 12.0–15.0)
LYMPHS ABS: 2.2 10*3/uL (ref 0.7–4.0)
LYMPHS PCT: 38 % (ref 12–46)
MCH: 29 pg (ref 26.0–34.0)
MCHC: 34.3 g/dL (ref 30.0–36.0)
MCV: 84.7 fL (ref 78.0–100.0)
Monocytes Absolute: 0.5 10*3/uL (ref 0.1–1.0)
Monocytes Relative: 9 % (ref 3–12)
NEUTROS ABS: 3 10*3/uL (ref 1.7–7.7)
Neutrophils Relative %: 51 % (ref 43–77)
Platelets: 210 10*3/uL (ref 150–400)
RBC: 4.24 MIL/uL (ref 3.87–5.11)
RDW: 12.4 % (ref 11.5–15.5)
WBC: 5.9 10*3/uL (ref 4.0–10.5)

## 2013-07-20 LAB — WET PREP, GENITAL
Clue Cells Wet Prep HPF POC: NONE SEEN
TRICH WET PREP: NONE SEEN
Yeast Wet Prep HPF POC: NONE SEEN

## 2013-07-20 LAB — URINALYSIS, ROUTINE W REFLEX MICROSCOPIC
BILIRUBIN URINE: NEGATIVE
Glucose, UA: NEGATIVE mg/dL
Ketones, ur: NEGATIVE mg/dL
Leukocytes, UA: NEGATIVE
Nitrite: NEGATIVE
Protein, ur: NEGATIVE mg/dL
SPECIFIC GRAVITY, URINE: 1.02 (ref 1.005–1.030)
Urobilinogen, UA: 0.2 mg/dL (ref 0.0–1.0)
pH: 6 (ref 5.0–8.0)

## 2013-07-20 LAB — POCT PREGNANCY, URINE
PREG TEST UR: NEGATIVE
PREG TEST UR: NEGATIVE

## 2013-07-20 LAB — URINE MICROSCOPIC-ADD ON

## 2013-07-20 MED ORDER — KETOROLAC TROMETHAMINE 60 MG/2ML IM SOLN
60.0000 mg | Freq: Once | INTRAMUSCULAR | Status: AC
  Administered 2013-07-20: 60 mg via INTRAMUSCULAR
  Filled 2013-07-20: qty 2

## 2013-07-20 NOTE — MAU Note (Signed)
Monday i had a lot of intense lower abd pain. Thought was bad cramps. Tues called office and told to take upt which was negative. Weds period started.  Yesterday was fine. Was ok today until 1800 when started having severe pain RLQ which radiates to R and mid R back and R side into ribs.

## 2013-07-20 NOTE — MAU Provider Note (Signed)
History     CSN: [id number]  Arrival date and time: 07/20/13 2035   None     Chief Complaint  Patient presents with  . Abdominal Pain  . Back Pain   HPI  48 yo [id number] not currently pregnancy who presents for abdominal and back pain of acute origin.    Monday pt had debilitating pain - lower abdomen and back. Felt like menstrual cramps. Tuesday pain continued but slightly improved. Called the doctor's office and was told to take a pregnancy test which was negative. Wednesday she got her period and the pain seemed to improve. Thursday she felt fine but then tonight her pain returned acutely 4 hours ago. Intense pain that radiates through her lower abdomen up her right side and into her back.  Took some midol and that took the level of pain down significantly.  Now 5-6/10 and 7-8/10 prior. Also feels bloated and has gained 6 lbs this week in "water weight"   +headaches, +nausea, +chills  No fevers, diarrhea, constipation. No cp, sob, dysuria, hematuria, increased frequency.  Sees Dr. Arelia SneddonMcComb.    Past Medical History  Diagnosis Date  . Allergy   . Depression   . Complication of anesthesia     Histerics    Past Surgical History  Procedure Laterality Date  . Breast surgery    . Tubal ligation      Family History  Problem Relation Age of Onset  . Heart disease Mother     Mitral valve prolapse  . Hyperlipidemia Father   . Heart disease Brother   . Heart disease Maternal Grandmother   . Heart disease Maternal Grandfather     History  Substance Use Topics  . Smoking status: Never Smoker   . Smokeless tobacco: Not on file  . Alcohol Use: No    Allergies:  Allergies  Allergen Reactions  . Antihistamines, Loratadine-Type     Heart races.  . Influenza Vaccine Live Swelling    Vaccine causes swelling and pain at injection site  . Pneumococcal Vaccines Swelling    Causes swelling and pain at injection site.  . Prednisone Other (See Comments)    Prednisone makes pt  Suicidal/ psychotic.    Prescriptions prior to admission  Medication Sig Dispense Refill  . amphetamine-dextroamphetamine (ADDERALL XR) 10 MG 24 hr capsule Take 10 mg by mouth daily.      . ARIPiprazole (ABILIFY) 5 MG tablet Take 5 mg by mouth daily.      . cholecalciferol (VITAMIN D) 1000 UNITS tablet Take 1,000 Units by mouth daily.      . Ibuprofen (MIDOL) 200 MG CAPS Take 2 capsules by mouth 2 (two) times daily as needed (For cramps.).      Marland Kitchen. lamoTRIgine (LAMICTAL) 200 MG tablet Take 200 mg by mouth daily.        Review of Systems  All other systems reviewed and are negative.   Physical Exam   Blood pressure 142/86, pulse 71, temperature 98.1 F (36.7 C), resp. rate 20, height 5\' 4"  (1.626 m), weight 95.255 kg (210 lb), last menstrual period 07/18/2013, SpO2 100.00%.  Physical Exam  Constitutional: She is oriented to person, place, and time. She appears well-developed and well-nourished.  Neck: Neck supple.  Cardiovascular: Normal rate.   Respiratory: Effort normal.  GI: Soft. Bowel sounds are normal. She exhibits no distension. There is tenderness (mild suprapubic, periumbilical and RLQ/RUQ pain. ). There is no rebound and no guarding.  Genitourinary:  NEFG Normal vagina with  physiologic discharge and vaginal bleeding from menses.  Cervix normal.no CMT Tenderness with manipulation of her uterus No adnexal tenderness Normal uterus in size.   Neurological: She is alert and oriented to person, place, and time.  Skin: Skin is warm and dry. No rash noted.  Psychiatric: She has a normal mood and affect.    MAU Course  Procedures  MDM CBC/CMP Wet prep GC/CHL  Assessment and Plan   48 yo with diffuse nonspecific abdominal pain and bloating in the setting of menses. Suspect pain is related to her cycle. No evidence of systemic infection and abdominal exam faily benign. Doubt appendicitis, ovarian torsion, cholecystitis.   - labs unremarkable - exam benign - symptoms  resolved with IM toradol in MAU. Which also points more toward pain from menses - rx of ibuprofen - recommend f/u with PCP on Monday if no improvement - reasons to return sooner given also.   Case and plan discussed with Dr. Margarite Gouge, Charlsey Moragne L MD 07/20/2013, 10:02 PM

## 2013-07-21 DIAGNOSIS — R109 Unspecified abdominal pain: Secondary | ICD-10-CM

## 2013-07-21 LAB — GC/CHLAMYDIA PROBE AMP
CT PROBE, AMP APTIMA: NEGATIVE
GC PROBE AMP APTIMA: NEGATIVE

## 2013-07-21 MED ORDER — IBUPROFEN 600 MG PO TABS: 600.0000 mg | ORAL_TABLET | Freq: Four times a day (QID) | ORAL | Status: DC | PRN

## 2013-07-21 NOTE — Discharge Instructions (Signed)
Abdominal Pain, Women °Abdominal (stomach, pelvic, or belly) pain can be caused by many things. It is important to tell your doctor: °· The location of the pain. °· Does it come and go or is it present all the time? °· Are there things that start the pain (eating certain foods, exercise)? °· Are there other symptoms associated with the pain (fever, nausea, vomiting, diarrhea)? °All of this is helpful to know when trying to find the cause of the pain. °CAUSES  °· Stomach: virus or bacteria infection, or ulcer. °· Intestine: appendicitis (inflamed appendix), regional ileitis (Crohn's disease), ulcerative colitis (inflamed colon), irritable bowel syndrome, diverticulitis (inflamed diverticulum of the colon), or cancer of the stomach or intestine. °· Gallbladder disease or stones in the gallbladder. °· Kidney disease, kidney stones, or infection. °· Pancreas infection or cancer. °· Fibromyalgia (pain disorder). °· Diseases of the female organs: °· Uterus: fibroid (non-cancerous) tumors or infection. °· Fallopian tubes: infection or tubal pregnancy. °· Ovary: cysts or tumors. °· Pelvic adhesions (scar tissue). °· Endometriosis (uterus lining tissue growing in the pelvis and on the pelvic organs). °· Pelvic congestion syndrome (female organs filling up with blood just before the menstrual period). °· Pain with the menstrual period. °· Pain with ovulation (producing an egg). °· Pain with an IUD (intrauterine device, birth control) in the uterus. °· Cancer of the female organs. °· Functional pain (pain not caused by a disease, may improve without treatment). °· Psychological pain. °· Depression. °DIAGNOSIS  °Your doctor will decide the seriousness of your pain by doing an examination. °· Blood tests. °· X-rays. °· Ultrasound. °· CT scan (computed tomography, special type of X-ray). °· MRI (magnetic resonance imaging). °· Cultures, for infection. °· Barium enema (dye inserted in the large intestine, to better view it with  X-rays). °· Colonoscopy (looking in intestine with a lighted tube). °· Laparoscopy (minor surgery, looking in abdomen with a lighted tube). °· Major abdominal exploratory surgery (looking in abdomen with a large incision). °TREATMENT  °The treatment will depend on the cause of the pain.  °· Many cases can be observed and treated at home. °· Over-the-counter medicines recommended by your caregiver. °· Prescription medicine. °· Antibiotics, for infection. °· Birth control pills, for painful periods or for ovulation pain. °· Hormone treatment, for endometriosis. °· Nerve blocking injections. °· Physical therapy. °· Antidepressants. °· Counseling with a psychologist or psychiatrist. °· Minor or major surgery. °HOME CARE INSTRUCTIONS  °· Do not take laxatives, unless directed by your caregiver. °· Take over-the-counter pain medicine only if ordered by your caregiver. Do not take aspirin because it can cause an upset stomach or bleeding. °· Try a clear liquid diet (broth or water) as ordered by your caregiver. Slowly move to a bland diet, as tolerated, if the pain is related to the stomach or intestine. °· Have a thermometer and take your temperature several times a day, and record it. °· Bed rest and sleep, if it helps the pain. °· Avoid sexual intercourse, if it causes pain. °· Avoid stressful situations. °· Keep your follow-up appointments and tests, as your caregiver orders. °· If the pain does not go away with medicine or surgery, you may try: °· Acupuncture. °· Relaxation exercises (yoga, meditation). °· Group therapy. °· Counseling. °SEEK MEDICAL CARE IF:  °· You notice certain foods cause stomach pain. °· Your home care treatment is not helping your pain. °· You need stronger pain medicine. °· You want your IUD removed. °· You feel faint or   lightheaded. °· You develop nausea and vomiting. °· You develop a rash. °· You are having side effects or an allergy to your medicine. °SEEK IMMEDIATE MEDICAL CARE IF:  °· Your  pain does not go away or gets worse. °· You have a fever. °· Your pain is felt only in portions of the abdomen. The right side could possibly be appendicitis. The left lower portion of the abdomen could be colitis or diverticulitis. °· You are passing blood in your stools (bright red or black tarry stools, with or without vomiting). °· You have blood in your urine. °· You develop chills, with or without a fever. °· You pass out. °MAKE SURE YOU:  °· Understand these instructions. °· Will watch your condition. °· Will get help right away if you are not doing well or get worse. °Document Released: 04/11/2007 Document Revised: 09/06/2011 Document Reviewed: 05/01/2009 °[employer]® Patient Information ©2014 [employer]. ° °

## 2013-07-31 ENCOUNTER — Encounter: Payer: Self-pay | Admitting: Internal Medicine

## 2013-08-29 ENCOUNTER — Encounter: Payer: Self-pay | Admitting: Internal Medicine

## 2013-08-30 ENCOUNTER — Other Ambulatory Visit: Payer: Self-pay | Admitting: Gastroenterology

## 2013-08-30 ENCOUNTER — Other Ambulatory Visit (INDEPENDENT_AMBULATORY_CARE_PROVIDER_SITE_OTHER): Payer: BC Managed Care – PPO

## 2013-08-30 ENCOUNTER — Encounter: Payer: Self-pay | Admitting: Internal Medicine

## 2013-08-30 ENCOUNTER — Ambulatory Visit (INDEPENDENT_AMBULATORY_CARE_PROVIDER_SITE_OTHER): Payer: BC Managed Care – PPO | Admitting: Internal Medicine

## 2013-08-30 VITALS — BP 114/82 | HR 64 | Ht 63.75 in | Wt 210.2 lb

## 2013-08-30 DIAGNOSIS — R109 Unspecified abdominal pain: Secondary | ICD-10-CM

## 2013-08-30 DIAGNOSIS — R1030 Lower abdominal pain, unspecified: Secondary | ICD-10-CM

## 2013-08-30 LAB — COMPREHENSIVE METABOLIC PANEL
ALK PHOS: 47 U/L (ref 39–117)
ALT: 14 U/L (ref 0–35)
AST: 18 U/L (ref 0–37)
Albumin: 4 g/dL (ref 3.5–5.2)
BILIRUBIN TOTAL: 0.5 mg/dL (ref 0.3–1.2)
BUN: 10 mg/dL (ref 6–23)
CO2: 25 mEq/L (ref 19–32)
Calcium: 9 mg/dL (ref 8.4–10.5)
Chloride: 104 mEq/L (ref 96–112)
Creatinine, Ser: 0.9 mg/dL (ref 0.4–1.2)
GFR: 71.42 mL/min (ref >60.00)
Glucose, Bld: 87 mg/dL (ref 70–99)
Potassium: 4 mEq/L (ref 3.5–5.1)
SODIUM: 137 meq/L (ref 135–145)
Total Protein: 7 g/dL (ref 6.0–8.3)

## 2013-08-30 LAB — CBC
HEMATOCRIT: 40.1 % (ref 36.0–46.0)
Hemoglobin: 13.6 g/dL (ref 12.0–15.0)
MCHC: 33.9 g/dL (ref 30.0–36.0)
MCV: 86.3 fl (ref 78.0–100.0)
Platelets: 271 10*3/uL (ref 150.0–400.0)
RBC: 4.64 Mil/uL (ref 3.87–5.11)
RDW: 13.7 % (ref 11.5–14.6)
WBC: 5.9 10*3/uL (ref 4.5–10.5)

## 2013-08-30 LAB — IGA: IgA: 136 mg/dL (ref 68–378)

## 2013-08-30 NOTE — Progress Notes (Signed)
Patient ID: Sharlet SalinaLisa R Simpson, female   DOB: October 11, 1965, 48 y.o.   MRN: [id number] HPI: Deanna Simpson is a 48 yo female with PMH of hyperlipidemia, depression, and interstitial cystitis who is seen in consultation at the request of Dr. Arelia SneddonMcComb to evaluate now resolved right lower quadrant abdominal pain. She reports in January 2015 she developed fairly sudden onset moderate to severe right lower quadrant abdominal pain which radiated across her abdomen. This was associated with pelvic and at times rectal pain. It was also associated with bloating and increased borborygmi. The pain did not seem to worsen with eating and she did not have a significant change in bowel movement. She denies diarrhea or constipation. She was seen in the emergency department for this pain and diagnosed with possible menstrual cramps. The pain was present though less severe over a four-week time period, and is now resolved. Today she reports she is feeling well. She denies nausea or vomiting. Good appetite. No heartburn. No diarrhea or constipation. No blood in her stool or melena. She reports she has had 2 pelvic ultrasounds which were negative. She's had another menstrual cycle since her painful episode and this did not reproduce or cause pain. She does have an appointment with urology this afternoon given her history of interstitial cystitis. No new medicines.  Of note she was treated by primary care with antibiotics approximately 10 days after the pain started and this seemed to help the pain significantly. She does not recall which antibiotic she took.  GI family history is noncontributory  Past Medical History  Diagnosis Date  . Allergy   . Depression   . Complication of anesthesia     Histerics  . Hyperlipemia   . Interstitial cystitis     Past Surgical History  Procedure Laterality Date  . Breast surgery    . Tubal ligation      Current Outpatient Prescriptions  Medication Sig Dispense Refill  .  amphetamine-dextroamphetamine (ADDERALL XR) 10 MG 24 hr capsule Take 10 mg by mouth daily.      . ARIPiprazole (ABILIFY) 5 MG tablet Take 5 mg by mouth daily.      . cholecalciferol (VITAMIN D) 1000 UNITS tablet Take 1,000 Units by mouth daily.      Marland Kitchen. ibuprofen (ADVIL,MOTRIN) 600 MG tablet Take 1 tablet (600 mg total) by mouth every 6 (six) hours as needed.  30 tablet  0  . lamoTRIgine (LAMICTAL) 200 MG tablet Take 200 mg by mouth daily.       No current facility-administered medications for this visit.    Allergies  Allergen Reactions  . Antihistamines, Loratadine-Type     Heart races.  . Influenza Vaccine Live Swelling    Vaccine causes swelling and pain at injection site  . Pneumococcal Vaccines Swelling    Causes swelling and pain at injection site.  . Prednisone Other (See Comments)    Prednisone makes pt Suicidal/ psychotic.    Family History  Problem Relation Age of Onset  . Heart disease Mother     Mitral valve prolapse  . Hyperlipidemia Father   . Heart disease Brother   . Heart disease Maternal Grandmother   . Heart disease Maternal Grandfather     History  Substance Use Topics  . Smoking status: Never Smoker   . Smokeless tobacco: Never Used  . Alcohol Use: No    ROS: As per history of present illness, otherwise negative  BP 114/82  Pulse 64  Ht 5' 3.75" (1.619 m)  Wt 210 lb 3.2 oz (95.346 kg)  BMI 36.38 kg/m2  LMP 07/29/2013 Constitutional: Well-developed and well-nourished. No distress. HEENT: Normocephalic and atraumatic. Oropharynx is clear and moist. No oropharyngeal exudate. Conjunctivae are normal.  No scleral icterus. Neck: Neck supple. Trachea midline. Cardiovascular: Normal rate, regular rhythm and intact distal pulses. No M/R/G Pulmonary/chest: Effort normal and breath sounds normal. No wheezing, rales or rhonchi. Abdominal: Soft, nontender, nondistended. Bowel sounds active throughout.  No hepatosplenomegaly. Extremities: no clubbing,  cyanosis, or edema Lymphadenopathy: No cervical adenopathy noted. Neurological: Alert and oriented to person place and time. Skin: Skin is warm and dry. No rashes noted. Psychiatric: Normal mood and affect. Behavior is normal.  RELEVANT LABS AND IMAGING: CBC    Component Value Date/Time   WBC 5.9 08/30/2013 1003   WBC 6.6 08/28/2012 1231   RBC 4.64 08/30/2013 1003   RBC 4.57 08/28/2012 1231   HGB 13.6 08/30/2013 1003   HGB 12.6 08/28/2012 1231   HCT 40.1 08/30/2013 1003   HCT 40.7 08/28/2012 1231   PLT 271.0 08/30/2013 1003   MCV 86.3 08/30/2013 1003   MCV 89.1 08/28/2012 1231   MCH 29.0 07/20/2013 2245   MCH 27.6 08/28/2012 1231   MCHC 33.9 08/30/2013 1003   MCHC 31.0* 08/28/2012 1231   RDW 13.7 08/30/2013 1003   LYMPHSABS 2.2 07/20/2013 2245   MONOABS 0.5 07/20/2013 2245   EOSABS 0.2 07/20/2013 2245   BASOSABS 0.0 07/20/2013 2245    CMP     Component Value Date/Time   NA 137 08/30/2013 1003   K 4.0 08/30/2013 1003   CL 104 08/30/2013 1003   CO2 25 08/30/2013 1003   GLUCOSE 87 08/30/2013 1003   BUN 10 08/30/2013 1003   CREATININE 0.9 08/30/2013 1003   CREATININE 0.79 08/28/2012 1229   CALCIUM 9.0 08/30/2013 1003   PROT 7.0 08/30/2013 1003   ALBUMIN 4.0 08/30/2013 1003   AST 18 08/30/2013 1003   ALT 14 08/30/2013 1003   ALKPHOS 47 08/30/2013 1003   BILITOT 0.5 08/30/2013 1003   GFRNONAA 86* 07/20/2013 2245   GFRAA >90 07/20/2013 2245    ASSESSMENT/PLAN: 48 yo female with PMH of hyperlipidemia, depression, and interstitial cystitis who is seen in consultation at the request of Dr. Arelia Sneddon to evaluate now resolved right lower quadrant abdominal pain.  1.  RLQ and lower abd pain, now resolved -- difficult to know completely what the etiology of her lower abdominal pain was. Certainly, she could have had diverticulitis which is now resolved. Interstitial cystitis is also in the differential. She has not had any change in her bowel habit or blood in her stool. I recommend CBC and CMP today which are unremarkable. I've also  recommended fecal occult blood testing which she will perform at home. If this is positive I would recommend proceeding to colonoscopy. Celiac panel today. We discussed imaging her abdomen with CT scan, but I would like her to first be seen by urology. They may also recommend abdominal imaging. She will call me back after this appointment with her urologist. Also asked that she notify me if this pain returns or she develop new symptoms such as change in bowel habit or blood in her stool. She voices understanding.

## 2013-08-30 NOTE — Patient Instructions (Signed)
Your physician has requested that you go to the basement for  lab work before leaving today  If Urology chooses to CT your abdomen, please ask that they do it with Contrast. If not call us.  Also call us if they choose to not CT you.  Please call our office if your symptoms worsen or return.

## 2013-08-31 LAB — TISSUE TRANSGLUTAMINASE, IGA: Tissue Transglutaminase Ab, IgA: 3.9 U/mL (ref <20)

## 2014-04-29 ENCOUNTER — Encounter: Payer: Self-pay | Admitting: Internal Medicine

## 2014-10-22 ENCOUNTER — Ambulatory Visit (INDEPENDENT_AMBULATORY_CARE_PROVIDER_SITE_OTHER): Payer: 59 | Admitting: Physician Assistant

## 2014-10-22 ENCOUNTER — Telehealth: Payer: Self-pay

## 2014-10-22 VITALS — BP 116/66 | HR 88 | Temp 98.2°F | Resp 16 | Ht 64.0 in | Wt 224.8 lb

## 2014-10-22 DIAGNOSIS — T7840XA Allergy, unspecified, initial encounter: Secondary | ICD-10-CM

## 2014-10-22 DIAGNOSIS — R21 Rash and other nonspecific skin eruption: Secondary | ICD-10-CM | POA: Diagnosis not present

## 2014-10-22 DIAGNOSIS — L299 Pruritus, unspecified: Secondary | ICD-10-CM

## 2014-10-22 MED ORDER — METHYLPREDNISOLONE ACETATE 80 MG/ML IJ SUSP
80.0000 mg | Freq: Once | INTRAMUSCULAR | Status: AC
  Administered 2014-10-22: 80 mg via INTRAMUSCULAR

## 2014-10-22 MED ORDER — HYDROXYZINE HCL 25 MG PO TABS: 12.5000 mg | ORAL_TABLET | Freq: Three times a day (TID) | ORAL | Status: DC | PRN

## 2014-10-22 MED ORDER — RANITIDINE HCL 150 MG PO TABS
150.0000 mg | ORAL_TABLET | Freq: Two times a day (BID) | ORAL | Status: DC
Start: 2014-10-22 — End: 2014-11-18

## 2014-10-22 NOTE — Patient Instructions (Signed)

## 2014-10-22 NOTE — Progress Notes (Signed)
10/22/2014 at 2:45 PM  Deanna Simpson R Simpson / DOB: 1965/12/03 / MRN: [id number]  The patient has Depression; Muscle pain, fibromyalgia; Hyperlipidemia; and Obesity on her problem list.  SUBJECTIVE  Chief complaint: Poison Ivy  Patient reports she came into contact with some poison IVY on 10/19/14 and since that time has been developing pruritis and rash.  She reports this has happened in the past and it took eight weeks before she started to feel better.  She denies a history of PUD, GERD.  She reports that she has used steroids about this time last year and had a pneumonia roughly 3 years ago which required steroids.  She denies a family history of osteoporosis. She has tried topical itch cream with some relief.  She has tolerated depomedrol without incident this time last year.    She  has a past medical history of Allergy; Depression; Complication of anesthesia; Hyperlipemia; and Interstitial cystitis.    Medications reviewed and updated by myself where necessary, and exist elsewhere in the encounter.   Deanna Simpson is allergic to antihistamines, loratadine-type; influenza vaccine live; pneumococcal vaccines; and prednisone. She  reports that she has never smoked. She has never used smokeless tobacco. She reports that she does not drink alcohol or use illicit drugs. She  reports that she currently engages in sexual activity. She reports using the following method of birth control/protection: Surgical. The patient  has past surgical history that includes Breast surgery and Tubal ligation.  Her family history includes Heart disease in her brother, maternal grandfather, maternal grandmother, and mother; Hyperlipidemia in her father.  Review of Systems  Constitutional: Negative for fever.  Respiratory: Negative for cough.   Cardiovascular: Negative for chest pain and palpitations.  Gastrointestinal: Negative for heartburn and diarrhea.  Genitourinary: Negative.   Musculoskeletal: Negative for myalgias.    Skin: Positive for itching and rash.  Neurological: Negative for dizziness and headaches.    OBJECTIVE  Her  height is 5\' 4"  (1.626 m) and weight is 224 lb 12.8 oz (101.969 kg). Her oral temperature is 98.2 F (36.8 C). Her blood pressure is 116/66 and her pulse is 88. Her respiration is 16 and oxygen saturation is 98%.  The patient's body mass index is 38.57 kg/(m^2).  Physical Exam  Constitutional: She is oriented to person, place, and time. She appears well-developed and well-nourished. No distress.  Cardiovascular: Normal rate, regular rhythm and normal heart sounds.   Respiratory: Effort normal and breath sounds normal.  GI: Soft.  Neurological: She is alert and oriented to person, place, and time. No cranial nerve deficit.  Skin: Skin is warm. Rash noted. She is not diaphoretic.     Psychiatric: She has a normal mood and affect.    No results found for this or any previous visit (from the past 24 hour(s)).  ASSESSMENT & PLAN  Deanna Simpson was seen today for poison ivy.  Diagnoses and all orders for this visit:  Allergic reaction, initial encounter Orders: -     methylPREDNISolone acetate (DEPO-MEDROL) injection 80 mg; Inject 1 mL (80 mg total) into the muscle once.  Rash and nonspecific skin eruption Orders: -     methylPREDNISolone acetate (DEPO-MEDROL) injection 80 mg; Inject 1 mL (80 mg total) into the muscle once.  Pruritic dermatitis Orders: -     ranitidine (ZANTAC) 150 MG tablet; Take 1 tablet (150 mg total) by mouth 2 (two) times daily. -     hydrOXYzine (ATARAX/VISTARIL) 25 MG tablet; Take 0.5-1 tablets (12.5-25 mg  total) by mouth every 8 (eight) hours as needed for anxiety or itching.    The patient was advised to call or come back to clinic if she does not see an improvement in symptoms, or worsens with the above plan.   Deliah Boston, MHS, PA-C [facility] 10/22/2014 2:45 PM

## 2014-10-22 NOTE — Telephone Encounter (Signed)
Pt states she was given a prescription for poison ivy a couple of years ago and would like to have another one called in since she have it again Please call pt at [phone]     [facility]

## 2014-10-22 NOTE — Telephone Encounter (Signed)
Called pt, left a detailed message letting pt know she needs to come in.

## 2014-11-18 ENCOUNTER — Other Ambulatory Visit: Payer: Self-pay | Admitting: Physician Assistant

## 2014-11-18 NOTE — Telephone Encounter (Signed)
MIchael advise on refill/ Does pt need to RTC?

## 2020-03-05 DIAGNOSIS — R55 Syncope and collapse: Secondary | ICD-10-CM | POA: Insufficient documentation

## 2020-08-21 DIAGNOSIS — R569 Unspecified convulsions: Secondary | ICD-10-CM | POA: Insufficient documentation

## 2021-01-22 LAB — HM PAP SMEAR: HPV, high-risk: NEGATIVE

## 2021-02-19 ENCOUNTER — Ambulatory Visit
Admission: EM | Admit: 2021-02-19 | Discharge: 2021-02-19 | Disposition: A | Discharge status: Home / Self Care | Payer: BC Managed Care – PPO

## 2021-02-19 ENCOUNTER — Ambulatory Visit: Payer: BC Managed Care – PPO | Admitting: Neurology

## 2021-02-19 DIAGNOSIS — J069 Acute upper respiratory infection, unspecified: Secondary | ICD-10-CM | POA: Diagnosis not present

## 2021-02-19 DIAGNOSIS — R062 Wheezing: Secondary | ICD-10-CM | POA: Diagnosis not present

## 2021-02-19 MED ORDER — DEXAMETHASONE SODIUM PHOSPHATE 10 MG/ML IJ SOLN
10.0000 mg | Freq: Once | INTRAMUSCULAR | Status: AC
  Administered 2021-02-19: 10 mg via INTRAMUSCULAR

## 2021-02-19 MED ORDER — PROMETHAZINE-DM 6.25-15 MG/5ML PO SYRP: 5.0000 mL | ORAL_SOLUTION | Freq: Four times a day (QID) | ORAL | 0 refills | Status: DC | PRN

## 2021-02-19 MED ORDER — ALBUTEROL SULFATE (2.5 MG/3ML) 0.083% IN NEBU: 2.5000 mg | INHALATION_SOLUTION | Freq: Four times a day (QID) | RESPIRATORY_TRACT | 0 refills | Status: DC | PRN

## 2021-02-19 MED ORDER — ALBUTEROL SULFATE HFA 108 (90 BASE) MCG/ACT IN AERS: 1.0000 | INHALATION_SPRAY | Freq: Four times a day (QID) | RESPIRATORY_TRACT | 0 refills | Status: DC | PRN

## 2021-02-19 NOTE — ED Provider Notes (Signed)
[facility]    CSN: [id number] Arrival date & time: 02/19/21  1600      History   Chief Complaint Chief Complaint  Patient presents with   Cough   Nasal Congestion   Headache    HPI DARRYL BLUMENSTEIN is a 55 y.o. female.   Presenting today with 2-day history of nasal congestion, productive cough, sore scratchy throat, wheezing, intermittent headache, fatigue.  She denies chest pain, shortness of breath, dizziness, abdominal pain nausea vomiting or diarrhea.  So far taking ibuprofen with mild relief of headache.  States that she typically gets a bronchitis type illness when she gets sick, has a nebulizer machine at home but needs nebulizer solution.  Husband sick with similar symptoms but was COVID-negative.  She is up-to-date on COVID vaccines and boosters.   Past Medical History:  Diagnosis Date   Allergy    Complication of anesthesia    Histerics   Depression    Hyperlipemia    Interstitial cystitis     Patient Active Problem List   Diagnosis Date Noted   Depression 08/10/2011   Muscle pain, fibromyalgia 08/10/2011   Hyperlipidemia 08/10/2011   Obesity 08/10/2011    Past Surgical History:  Procedure Laterality Date   BREAST SURGERY     TUBAL LIGATION      OB History     Gravida  4   Para  3   Term  3   Preterm      AB  1   Living  3      SAB  1   IAB      Ectopic      Multiple      Live Births               Home Medications    Prior to Admission medications   Medication Sig Start Date End Date Taking? Authorizing Provider  albuterol (PROVENTIL) (2.5 MG/3ML) 0.083% nebulizer solution Take 3 mLs (2.5 mg total) by nebulization every 6 (six) hours as needed for wheezing or shortness of breath. 02/19/21  Yes Particia Nearing, PA-C  albuterol (VENTOLIN HFA) 108 (90 Base) MCG/ACT inhaler Inhale 1-2 puffs into the lungs every 6 (six) hours as needed for wheezing or shortness of breath. 02/19/21  Yes Particia Nearing,  PA-C  promethazine-dextromethorphan (PROMETHAZINE-DM) 6.25-15 MG/5ML syrup Take 5 mLs by mouth 4 (four) times daily as needed for cough. 02/19/21  Yes Particia Nearing, PA-C  amphetamine-dextroamphetamine (ADDERALL XR) 10 MG 24 hr capsule Take 10 mg by mouth daily.    [provider]  ARIPiprazole (ABILIFY) 5 MG tablet Take 5 mg by mouth daily.    [provider]  cholecalciferol (VITAMIN D) 1000 UNITS tablet Take 1,000 Units by mouth daily.    [provider]  hydrOXYzine (ATARAX/VISTARIL) 25 MG tablet Take 0.5-1 tablets (12.5-25 mg total) by mouth every 8 (eight) hours as needed for anxiety or itching. 10/22/14   Ofilia Neas, PA-C  ibuprofen (ADVIL,MOTRIN) 600 MG tablet Take 1 tablet (600 mg total) by mouth every 6 (six) hours as needed. 07/21/13   Vale Haven, MD  lamoTRIgine (LAMICTAL) 200 MG tablet Take 200 mg by mouth daily.    [provider]  potassium chloride (KLOR-CON) 10 MEQ tablet Take 10 mEq by mouth daily. 10/02/20   [provider]  ranitidine (ZANTAC) 150 MG tablet TAKE 1 TABLET (150 MG TOTAL) BY MOUTH 2 (TWO) TIMES DAILY. 11/18/14   Ofilia Neas,  PA-C  rOPINIRole (REQUIP) 1 MG tablet Take 1 mg by mouth at bedtime. 10/06/20   [provider]    Family History Family History  Problem Relation Age of Onset   Heart disease Mother        Mitral valve prolapse   Hyperlipidemia Father    Heart disease Brother    Heart disease Maternal Grandmother    Heart disease Maternal Grandfather     Social History Social History   Tobacco Use   Smoking status: Never   Smokeless tobacco: Never  Substance Use Topics   Alcohol use: No   Drug use: No     Allergies   Antihistamines, loratadine-type; Influenza vaccine live; Pneumococcal vaccines; and Prednisone   Review of Systems Review of Systems PER HPI  Physical Exam Triage Vital Signs ED Triage Vitals  Enc Vitals Group     BP 02/19/21 1631 (!) 138/95      Pulse Rate 02/19/21 1631 94     Resp 02/19/21 1631 18     Temp 02/19/21 1631 98.3 F (36.8 C)     Temp Source 02/19/21 1631 Oral     SpO2 02/19/21 1631 95 %     Weight --      Height --      Head Circumference --      Peak Flow --      Pain Score 02/19/21 1634 6     Pain Loc --      Pain Edu? --      Excl. in GC? --    No data found.  Updated Vital Signs BP (!) 138/95 (BP Location: Left Arm)   Pulse 94   Temp 98.3 F (36.8 C) (Oral)   Resp 18   LMP 07/29/2013   SpO2 95%   Visual Acuity Right Eye Distance:   Left Eye Distance:   Bilateral Distance:    Right Eye Near:   Left Eye Near:    Bilateral Near:     Physical Exam Vitals and nursing note reviewed.  Constitutional:      Appearance: Normal appearance. She is not ill-appearing.  HENT:     Head: Atraumatic.     Right Ear: Tympanic membrane normal.     Left Ear: Tympanic membrane normal.     Nose: Rhinorrhea present.     Mouth/Throat:     Mouth: Mucous membranes are moist.     Pharynx: Posterior oropharyngeal erythema present. No oropharyngeal exudate.  Eyes:     Extraocular Movements: Extraocular movements intact.     Conjunctiva/sclera: Conjunctivae normal.  Cardiovascular:     Rate and Rhythm: Normal rate and regular rhythm.     Heart sounds: Normal heart sounds.  Pulmonary:     Effort: Pulmonary effort is normal. No respiratory distress.     Breath sounds: Wheezing present. No rales.     Comments: Moderate diffuse wheezes bilaterally Abdominal:     General: Bowel sounds are normal. There is no distension.     Palpations: Abdomen is soft.     Tenderness: There is no abdominal tenderness. There is no guarding.  Musculoskeletal:        General: Normal range of motion.     Cervical back: Normal range of motion and neck supple.  Skin:    General: Skin is warm and dry.  Neurological:     Mental Status: She is alert and oriented to person, place, and time.  Psychiatric:        Mood and  Affect: Mood  normal.        Thought Content: Thought content normal.        Judgment: Judgment normal.     UC Treatments / Results  Labs (all labs ordered are listed, but only abnormal results are displayed) Labs Reviewed  NOVEL CORONAVIRUS, NAA    EKG   Radiology No results found.  Procedures Procedures (including critical care time)  Medications Ordered in UC Medications  dexamethasone (DECADRON) injection 10 mg (10 mg Intramuscular Given 02/19/21 1700)    Initial Impression / Assessment and Plan / UC Course  I have reviewed the triage vital signs and the nursing notes.  Pertinent labs & imaging results that were available during my care of the patient were reviewed by me and considered in my medical decision making (see chart for details).     Suspect viral illness causing symptoms, turning into bronchitis.  We will treat with IM Decadron, albuterol inhaler and nebulizer solution, Phenergan DM.  Recommended Mucinex and supportive home care additionally.  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  Has taken prednisone multiple times since this episode without issue.  Strict return precautions given for acutely worsening symptoms.  Final Clinical Impressions(s) / UC Diagnoses   Final diagnoses:  Viral URI with cough  Wheezing   Discharge Instructions   None    ED Prescriptions     Medication Sig Dispense Auth. Provider   albuterol (VENTOLIN HFA) 108 (90 Base) MCG/ACT inhaler Inhale 1-2 puffs into the lungs every 6 (six) hours as needed for wheezing or shortness of breath. 18 g Particia Nearing, [place]   albuterol (PROVENTIL) (2.5 MG/3ML) 0.083% nebulizer solution Take 3 mLs (2.5 mg total) by nebulization every 6 (six) hours as needed for wheezing or shortness of  breath. 75 mL Particia Nearing, [place]   promethazine-dextromethorphan (PROMETHAZINE-DM) 6.25-15 MG/5ML syrup Take 5 mLs by mouth 4 (four) times daily as needed for cough. 100 mL Particia Nearing, [place]      PDMP not reviewed this encounter.   Particia Nearing, [place] 02/19/21 1708

## 2021-02-19 NOTE — ED Triage Notes (Signed)
Two day h/o congestion, productive cough, sore throat, intermittent HA and wheezing. Has been taking ibuprofen with some HA relief. Denies n/v/d and abdominal pain. No sob or cp. Husband is sick, but does not have covid. Pt is covid vaccinated, no booster. Pt needs a refill on potassium meds.

## 2021-02-21 LAB — NOVEL CORONAVIRUS, NAA: SARS-CoV-2, NAA: NOT DETECTED

## 2021-02-21 LAB — SARS-COV-2, NAA 2 DAY TAT

## 2021-02-23 ENCOUNTER — Encounter: Payer: Self-pay | Admitting: Neurology

## 2021-04-01 ENCOUNTER — Encounter: Payer: Self-pay | Admitting: Neurology

## 2021-04-01 ENCOUNTER — Ambulatory Visit (INDEPENDENT_AMBULATORY_CARE_PROVIDER_SITE_OTHER): Payer: BC Managed Care – PPO | Admitting: Neurology

## 2021-04-01 ENCOUNTER — Other Ambulatory Visit: Payer: Self-pay

## 2021-04-01 VITALS — BP 117/83 | HR 83 | Ht 63.0 in | Wt 232.0 lb

## 2021-04-01 DIAGNOSIS — G629 Polyneuropathy, unspecified: Secondary | ICD-10-CM

## 2021-04-01 DIAGNOSIS — Q283 Other malformations of cerebral vessels: Secondary | ICD-10-CM | POA: Diagnosis not present

## 2021-04-01 DIAGNOSIS — R569 Unspecified convulsions: Secondary | ICD-10-CM

## 2021-04-01 NOTE — Patient Instructions (Signed)
Continue current medications, no changes  Return in 6 months or sooner if worse.

## 2021-04-01 NOTE — Progress Notes (Signed)
[facility]  PATIENT: Deanna Simpson DOB: 1965-11-14  REFERRING CLINICIAN: Richardean Chimera, MD HISTORY FROM: Patient  REASON FOR VISIT: To establish care/ AVM/ Neuropathy    HISTORICAL  CHIEF COMPLAINT:  Chief Complaint  Patient presents with   New Patient (Initial Visit)    Here to discuss intercranial blood clot and if treatment plan is needed, neuropathy in knees and foot    HISTORY OF PRESENT ILLNESS:  This is a 55 year old woman with past medical history of diabetes mellitus type 2, obesity, bipolar disorder, peripheral neuropathy and AVM who is presenting to establish care.  Patient stated that in the month of February she she was admitted in the hospital in [place] after presenting with 4 episodes of difficulty with speech.  Patient said that she had difficulty with speech lasting less than 30 seconds, and after the fourth event, she presented to the ED.  She was also stating that the difficulty speaking was associated with body locking up.  She had a head CT and MRI showing AVM in the left frontal lobe and plan was at that time to follow up with neurosurgery and neurology as outpatient.  Patient stated since February she has not had any additional episodes of difficulty speaking.   She also noted that in the beginning of the year she started having recurrent syncope, she said her last episode was in June.  At that time they thought the syncope was related to medications interaction, she had medications changed but was not told the etiology of her syncope.  Her last episode was in June.    She also has a history of neuropathy previously was taking duloxetine 30 mg twice daily but with her recurrent syncopal episodes she has stopped the duloxetine.  She said during that time her neuropathy was worse but since there was improvement of her syncopal episodes she restarted the duloxetine and she noted some improvement in her neuropathy.  Currently she is back on Duloxetine  30 mg BID. She described as numbness on both feet localized on top of the toes and top of the foot.  Denies any pain denies any tingling sensation.  Denies any history of back pain.    OTHER MEDICAL CONDITIONS: Diabetes mellitus type 2 hypertension, obesity, bipolar disorder, peripheral neuropathy   REVIEW OF SYSTEMS: Full 14 system review of systems performed and negative with exception of: As noted in the HPI  ALLERGIES: Allergies  Allergen Reactions   Antihistamines, Loratadine-Type     Heart races.   Influenza Vaccine Live Swelling    Vaccine causes swelling and pain at injection site   Pneumococcal Vaccines Swelling    Causes swelling and pain at injection site.   Prednisone Other (See Comments)    Prednisone makes pt Suicidal/ psychotic.    HOME MEDICATIONS: Outpatient Medications Prior to Visit  Medication Sig Dispense Refill   ARIPiprazole (ABILIFY) 5 MG tablet Take 5 mg by mouth daily.     cholecalciferol (VITAMIN D) 1000 UNITS tablet Take 1,000 Units by mouth daily.     DULoxetine (CYMBALTA) 30 MG capsule 1 capsule     lamoTRIgine (LAMICTAL) 200 MG tablet Take 200 mg by mouth daily.     potassium chloride (KLOR-CON) 10 MEQ tablet Take 10 mEq by mouth daily.     rOPINIRole (REQUIP) 1 MG tablet Take 1 mg by mouth at bedtime.     albuterol (PROVENTIL) (2.5 MG/3ML) 0.083% nebulizer solution Take 3 mLs (2.5 mg total) by nebulization every 6 (  six) hours as needed for wheezing or shortness of breath. 75 mL 0   albuterol (VENTOLIN HFA) 108 (90 Base) MCG/ACT inhaler Inhale 1-2 puffs into the lungs every 6 (six) hours as needed for wheezing or shortness of breath. 18 g 0   amphetamine-dextroamphetamine (ADDERALL XR) 10 MG 24 hr capsule Take 10 mg by mouth daily.     hydrOXYzine (ATARAX/VISTARIL) 25 MG tablet Take 0.5-1 tablets (12.5-25 mg total) by mouth every 8 (eight) hours as needed for anxiety or itching. 30 tablet 0   ibuprofen (ADVIL,MOTRIN) 600 MG tablet Take 1 tablet (600  mg total) by mouth every 6 (six) hours as needed. 30 tablet 0   promethazine-dextromethorphan (PROMETHAZINE-DM) 6.25-15 MG/5ML syrup Take 5 mLs by mouth 4 (four) times daily as needed for cough. 100 mL 0   ranitidine (ZANTAC) 150 MG tablet TAKE 1 TABLET (150 MG TOTAL) BY MOUTH 2 (TWO) TIMES DAILY. 60 tablet 0   No facility-administered medications prior to visit.    PAST MEDICAL HISTORY: Past Medical History:  Diagnosis Date   Allergy    Complication of anesthesia    Histerics   Depression    Hyperlipemia    Interstitial cystitis     PAST SURGICAL HISTORY: Past Surgical History:  Procedure Laterality Date   BREAST SURGERY     TUBAL LIGATION      FAMILY HISTORY: Family History  Problem Relation Age of Onset   Heart disease Mother        Mitral valve prolapse   Hyperlipidemia Father    Heart disease Brother    Heart disease Maternal Grandmother    Heart disease Maternal Grandfather     SOCIAL HISTORY: Social History   Socioeconomic History   Marital status: Married    Spouse name: Not on file   Number of children: 3   Years of education: Not on file   Highest education level: Not on file  Occupational History   Occupation: TEACHER    Employer: [employer]  Tobacco Use   Smoking status: Never   Smokeless tobacco: Never  Substance and Sexual Activity   Alcohol use: No   Drug use: No   Sexual activity: Yes    Birth control/protection: Surgical  Other Topics Concern   Not on file  Social History Narrative   Not on file   Social Determinants of Health   Financial Resource Strain: Not on file  Food Insecurity: Not on file  Transportation Needs: Not on file  Physical Activity: Not on file  Stress: Not on file  Social Connections: Not on file  Intimate Partner Violence: Not on file     PHYSICAL EXAM  GENERAL EXAM/CONSTITUTIONAL: Vitals:  Vitals:   04/01/21 1437  BP: 117/83  Pulse: 83  Weight: 232 lb (105.2 kg)  Height: 5\' 3"  (1.6 m)    Body mass index is 41.1 kg/m. Wt Readings from Last 3 Encounters:  04/01/21 232 lb (105.2 kg)  10/22/14 224 lb 12.8 oz (102 kg)  08/30/13 210 lb 3.2 oz (95.3 kg)   Patient is in no distress; well developed, nourished and groomed; neck is supple  EYES: Pupils round and reactive to light, Visual fields full to confrontation, Extraocular movements intacts,   MUSCULOSKELETAL: Gait, strength, tone, movements noted in Neurologic exam below  NEUROLOGIC: MENTAL STATUS:  No flowsheet data found. awake, alert, oriented to person, place and time recent and remote memory intact normal attention and concentration language fluent, comprehension intact, naming intact fund of knowledge appropriate  CRANIAL NERVE:  2nd, 3rd, 4th, 6th - pupils equal and reactive to light, visual fields full to confrontation, extraocular muscles intact, no nystagmus 5th - facial sensation symmetric 7th - facial strength symmetric 8th - hearing intact 9th - palate elevates symmetrically, uvula midline 11th - shoulder shrug symmetric 12th - tongue protrusion midline  MOTOR:  normal bulk and tone, full strength in the BUE, BLE  SENSORY:  Decrease pinprick and vibration in both feet up to ankle.   COORDINATION:  finger-nose-finger, fine finger movements normal  REFLEXES:  deep tendon reflexes present and symmetric  GAIT/STATION:  normal    DIAGNOSTIC DATA (LABS, IMAGING, TESTING) - I reviewed patient records, labs, notes, testing and imaging myself where available.  Lab Results  Component Value Date   WBC 5.9 08/30/2013   HGB 13.6 08/30/2013   HCT 40.1 08/30/2013   MCV 86.3 08/30/2013   PLT 271.0 08/30/2013      Component Value Date/Time   NA 137 08/30/2013 1003   K 4.0 08/30/2013 1003   CL 104 08/30/2013 1003   CO2 25 08/30/2013 1003   GLUCOSE 87 08/30/2013 1003   BUN 10 08/30/2013 1003   CREATININE 0.9 08/30/2013 1003   CREATININE 0.79 08/28/2012 1229   CALCIUM 9.0 08/30/2013  1003   PROT 7.0 08/30/2013 1003   ALBUMIN 4.0 08/30/2013 1003   AST 18 08/30/2013 1003   ALT 14 08/30/2013 1003   ALKPHOS 47 08/30/2013 1003   BILITOT 0.5 08/30/2013 1003   GFRNONAA 86 (L) 07/20/2013 2245   GFRAA >90 07/20/2013 2245   No results found for: CHOL, HDL, LDLCALC, LDLDIRECT, TRIG, CHOLHDL No results found for: IWPY0D No results found for: VITAMINB12 No results found for: TSH  CT Head 08/21/2020 1. Findings suggesting a high left frontal convexity developmental venous malformation (DVM), which can be confirmed with gadolinium-enhanced MRI. This is more conspicuous on the present examination, however, is likely unchanged in the interval. 2. No acute intracranial pathology evident.   CTA Head and Neck  1. No intracranial proximal arterial stenosis/occlusion or aneurysm. 2. Branching venous structure in the paramedian left frontal lobe likely reflects a developmental venous anomaly. There is concern for thrombosis along the major draining vein as it extends from the cortex to the left aspect of the interhemispheric falx cerebri. This finding is best visualized on series 604 image 91 when compared to coronal CT reconstructions of the head dated 07/30/2020 and 03/05/2020. This finding is also visualized on the axial series (series 5 image 105). MRV may be obtained for further characterization if clinically indicated. CTA Neck: No cervical carotid or vertebral artery stenosis/occlusion or dissection   MRI Brain with and without contrast  1. No acute infarct, mass, or intracranial hemorrhage. 2. Signal abnormalities and enhancement along the paramedian left frontal lobe are most consistent with a large developmental venous anomaly. No arterial involvement or underlying vascular nidus is identified to suggest an arteriovenous malformation or dislocation. Mild FLAIR signal abnormality is present along the cortex of the paramedian left frontal lobe in this region, possibly reflecting a component  of edema or gliosis. These findings could also reflect evidence of partial thrombosis of the DVA given the patient's presentation and marked hyperattenuation in this region on prior CT. MRV or CTV imaging of the brain may allow for additional characterization. 3. The hippocampi are symmetric in size without abnormal intrinsic signal. There is no evidence for cortical dysplasia or gray matter heterotopia   MRV Brain  Superior sagittal sinus is  normal. There is mildly tapered caliber of the transverse sinuses bilaterally, more pronounced on the left, without a filling defects to indicate thrombosis. The sigmoid sinuses are normal bilaterally. Internal cerebral veins and straight sinus is normal. Right frontal lobe DVA   MRI Brain 11/27/2020 1.  No evidence of acute intracranial hemorrhage, infarction or mass.  2.  Large left frontal developmental venous anomaly, similar in appearance to prior imaging.  3.  Abnormalities in the periventricular and subcortical white matter are nonspecific.  Given the patient's age these findings likely represent mild chronic microvascular ischemic changes    ASSESSMENT AND PLAN  55 y.o. year old female with past medical history of diabetes mellitus, obesity, hypertension and bipolar disorder who is presenting to establish care for her developmental venous anomaly and peripheral neuropathy.  Currently, patient does not have any symptoms.  But I do believe in February when she initially presented to the hospital with difficulty speaking and body tensing up, those episodes might be seizures.  She is already on Lamictal 200 mg for mood/anxiety/depression.  I explained to the patient that I will not start her on antiseizure medication because she is currently on Lamictal and she is on a good dose.  Advised the patient to call me if she has had any symptoms that is concerning for seizures including speech arrest, difficulty speaking or any abnormal movement.  Because of the  developmental venous anomaly I also advised the patient to present to the ED if she has any new or worsening headache, any trouble with vision or any additional neurological symptoms. For her neuropathy she is already on duloxetine 30 mg twice daily and report good control.  I will continue the same medication no change.  I will see the patient in 6 months for follow-up.   1. Developmental venous anomaly, cerebral   2. Seizure-like activity (HCC)   3. Neuropathy     PLAN: Continue current medications, no changes  Return in 6 months or sooner if worse.   No orders of the defined types were placed in this encounter.   No orders of the defined types were placed in this encounter.   Return in about 6 months (around 09/30/2021).    Windell Norfolk, MD 04/01/2021, 5:09 PM  [facility] [address], [address] [place], [place] [address] [phone]

## 2021-04-22 ENCOUNTER — Other Ambulatory Visit: Payer: Self-pay | Admitting: Obstetrics and Gynecology

## 2021-04-22 DIAGNOSIS — R928 Other abnormal and inconclusive findings on diagnostic imaging of breast: Secondary | ICD-10-CM

## 2021-05-02 ENCOUNTER — Ambulatory Visit
Admission: RE | Admit: 2021-05-02 | Discharge: 2021-05-02 | Disposition: A | Discharge status: Home / Self Care | Payer: BC Managed Care – PPO | Source: Ambulatory Visit | Attending: Obstetrics and Gynecology | Admitting: Obstetrics and Gynecology

## 2021-05-02 ENCOUNTER — Other Ambulatory Visit: Payer: Self-pay

## 2021-05-02 DIAGNOSIS — R928 Other abnormal and inconclusive findings on diagnostic imaging of breast: Secondary | ICD-10-CM

## 2021-05-07 ENCOUNTER — Ambulatory Visit (INDEPENDENT_AMBULATORY_CARE_PROVIDER_SITE_OTHER): Payer: BC Managed Care – PPO | Admitting: Neurology

## 2021-05-07 ENCOUNTER — Encounter: Payer: Self-pay | Admitting: Neurology

## 2021-05-07 ENCOUNTER — Other Ambulatory Visit: Payer: Self-pay

## 2021-05-07 VITALS — BP 118/76 | HR 73 | Ht 63.0 in | Wt 223.0 lb

## 2021-05-07 DIAGNOSIS — G629 Polyneuropathy, unspecified: Secondary | ICD-10-CM

## 2021-05-07 DIAGNOSIS — M5416 Radiculopathy, lumbar region: Secondary | ICD-10-CM

## 2021-05-07 DIAGNOSIS — Q283 Other malformations of cerebral vessels: Secondary | ICD-10-CM

## 2021-05-07 NOTE — Patient Instructions (Signed)
Continue with Duloxetine 60 mg (2 tabs in the morning)  MRI Lumbar spine without contrast  Follow up as scheduled in 6 months

## 2021-05-07 NOTE — Progress Notes (Signed)
[facility]  PATIENT: Deanna Simpson DOB: 1965-12-19  REFERRING CLINICIAN: Richardean Chimera, MD HISTORY FROM: Patient  REASON FOR VISIT: To establish care/ AVM/ Neuropathy    HISTORICAL  CHIEF COMPLAINT:  Chief Complaint  Patient presents with   Follow-up    Room 12 - alone. Patient requested early follow up for worsening neuropathy. Reports only taking duloxetine 30mg , one capsule daily.     INTERVAL HISTORY 05/07/2021 Patient presents today for follow-up.  Last visit was on October 5, at that time we discussed about her AVM and bilateral feet numbness.  She is present today with new complaint of right thigh shooting pain, mostly anterior part of right thigh. Patient stated she was driving the other day and lost sensation/feeling of her right leg, episode lasted about 1 hour, on top of that she also noted when lifting her right leg up during exercise she will have a shooting pain.  Denies any previous back pain denies any previous back injury.  In terms of bilateral feet numbness patient stated the sensation is the same no improvement, she is supposed to take duloxetine 30 mg twice daily but she is only taking 30 mg in the morning, forgetting her evening dose.      HISTORY OF PRESENT ILLNESS:  This is a 55 year old woman with past medical history of diabetes mellitus type 2, obesity, bipolar disorder, peripheral neuropathy and AVM who is presenting to establish care.  Patient stated that in the month of February she she was admitted in the hospital in March after presenting with 4 episodes of difficulty with speech.  Patient said that she had difficulty with speech lasting less than 30 seconds, and after the fourth event, she presented to the ED.  She was also stating that the difficulty speaking was associated with body locking up.  She had a head CT and MRI showing AVM in the left frontal lobe and plan was at that time to follow up with neurosurgery and neurology as  outpatient.  Patient stated since February she has not had any additional episodes of difficulty speaking.   She also noted that in the beginning of the year she started having recurrent syncope, she said her last episode was in June.  At that time they thought the syncope was related to medications interaction, she had medications changed but was not told the etiology of her syncope.  Her last episode was in June.    She also has a history of neuropathy previously was taking duloxetine 30 mg twice daily but with her recurrent syncopal episodes she has stopped the duloxetine.  She said during that time her neuropathy was worse but since there was improvement of her syncopal episodes she restarted the duloxetine and she noted some improvement in her neuropathy.  Currently she is back on Duloxetine 30 mg BID. She described as numbness on both feet localized on top of the toes and top of the foot.  Denies any pain denies any tingling sensation.  Denies any history of back pain.    OTHER MEDICAL CONDITIONS: Diabetes mellitus type 2 hypertension, obesity, bipolar disorder, peripheral neuropathy   REVIEW OF SYSTEMS: Full 14 system review of systems performed and negative with exception of: As noted in the HPI  ALLERGIES: Allergies  Allergen Reactions   Antihistamines, Loratadine-Type     Heart races.   Influenza Vaccine Live Swelling    Vaccine causes swelling and pain at injection site   Pneumococcal Vaccines Swelling  Causes swelling and pain at injection site.   Prednisone Other (See Comments)    Prednisone makes pt Suicidal/ psychotic.    HOME MEDICATIONS: Outpatient Medications Prior to Visit  Medication Sig Dispense Refill   ARIPiprazole (ABILIFY) 5 MG tablet Take 5 mg by mouth daily.     cholecalciferol (VITAMIN D) 1000 UNITS tablet Take 1,000 Units by mouth daily.     DULoxetine (CYMBALTA) 30 MG capsule Take 30 mg by mouth daily.     lamoTRIgine (LAMICTAL) 200 MG tablet Take 200 mg  by mouth daily.     potassium chloride (KLOR-CON) 10 MEQ tablet Take 10 mEq by mouth daily.     rOPINIRole (REQUIP) 1 MG tablet Take 1 mg by mouth at bedtime.     No facility-administered medications prior to visit.    PAST MEDICAL HISTORY: Past Medical History:  Diagnosis Date   Allergy    Complication of anesthesia    Histerics   Depression    Hyperlipemia    Interstitial cystitis     PAST SURGICAL HISTORY: Past Surgical History:  Procedure Laterality Date   BREAST SURGERY     TUBAL LIGATION      FAMILY HISTORY: Family History  Problem Relation Age of Onset   Heart disease Mother        Mitral valve prolapse   Hyperlipidemia Father    Heart disease Brother    Heart disease Maternal Grandmother    Heart disease Maternal Grandfather     SOCIAL HISTORY: Social History   Socioeconomic History   Marital status: Married    Spouse name: Not on file   Number of children: 3   Years of education: Not on file   Highest education level: Not on file  Occupational History   Occupation: TEACHER    Employer: [employer]  Tobacco Use   Smoking status: Never   Smokeless tobacco: Never  Substance and Sexual Activity   Alcohol use: No   Drug use: No   Sexual activity: Yes    Birth control/protection: Surgical  Other Topics Concern   Not on file  Social History Narrative   Not on file   Social Determinants of Health   Financial Resource Strain: Not on file  Food Insecurity: Not on file  Transportation Needs: Not on file  Physical Activity: Not on file  Stress: Not on file  Social Connections: Not on file  Intimate Partner Violence: Not on file     PHYSICAL EXAM  GENERAL EXAM/CONSTITUTIONAL: Vitals:  Vitals:   05/07/21 0949  BP: 118/76  Pulse: 73  Weight: 223 lb (101.2 kg)  Height: 5\' 3"  (1.6 m)   Body mass index is 39.5 kg/m. Wt Readings from Last 3 Encounters:  05/07/21 223 lb (101.2 kg)  04/01/21 232 lb (105.2 kg)  10/22/14 224 lb 12.8 oz  (102 kg)   Patient is in no distress; well developed, nourished and groomed; neck is supple  EYES: Pupils round and reactive to light, Visual fields full to confrontation, Extraocular movements intacts,   MUSCULOSKELETAL: Gait, strength, tone, movements noted in Neurologic exam below  NEUROLOGIC: MENTAL STATUS:  No flowsheet data found. awake, alert, oriented to person, place and time recent and remote memory intact normal attention and concentration language fluent, comprehension intact, naming intact fund of knowledge appropriate  CRANIAL NERVE:  2nd, 3rd, 4th, 6th - pupils equal and reactive to light, visual fields full to confrontation, extraocular muscles intact, no nystagmus 5th - facial sensation symmetric 7th -  facial strength symmetric 8th - hearing intact 9th - palate elevates symmetrically, uvula midline 11th - shoulder shrug symmetric 12th - tongue protrusion midline  MOTOR:  normal bulk and tone, full strength in the BUE, BLE  SENSORY:  Decrease pinprick and vibration in both feet up to ankle.   COORDINATION:  finger-nose-finger, fine finger movements normal  REFLEXES:  deep tendon reflexes present and symmetric  GAIT/STATION:  normal   DIAGNOSTIC DATA (LABS, IMAGING, TESTING) - I reviewed patient records, labs, notes, testing and imaging myself where available.  Lab Results  Component Value Date   WBC 5.9 08/30/2013   HGB 13.6 08/30/2013   HCT 40.1 08/30/2013   MCV 86.3 08/30/2013   PLT 271.0 08/30/2013      Component Value Date/Time   NA 137 08/30/2013 1003   K 4.0 08/30/2013 1003   CL 104 08/30/2013 1003   CO2 25 08/30/2013 1003   GLUCOSE 87 08/30/2013 1003   BUN 10 08/30/2013 1003   CREATININE 0.9 08/30/2013 1003   CREATININE 0.79 08/28/2012 1229   CALCIUM 9.0 08/30/2013 1003   PROT 7.0 08/30/2013 1003   ALBUMIN 4.0 08/30/2013 1003   AST 18 08/30/2013 1003   ALT 14 08/30/2013 1003   ALKPHOS 47 08/30/2013 1003   BILITOT 0.5  08/30/2013 1003   GFRNONAA 86 (L) 07/20/2013 2245   GFRAA >90 07/20/2013 2245   No results found for: CHOL, HDL, LDLCALC, LDLDIRECT, TRIG, CHOLHDL No results found for: TOIZ1I No results found for: VITAMINB12 No results found for: TSH  CT Head 08/21/2020 1. Findings suggesting a high left frontal convexity developmental venous malformation (DVM), which can be confirmed with gadolinium-enhanced MRI. This is more conspicuous on the present examination, however, is likely unchanged in the interval. 2. No acute intracranial pathology evident.   CTA Head and Neck  1. No intracranial proximal arterial stenosis/occlusion or aneurysm. 2. Branching venous structure in the paramedian left frontal lobe likely reflects a developmental venous anomaly. There is concern for thrombosis along the major draining vein as it extends from the cortex to the left aspect of the interhemispheric falx cerebri. This finding is best visualized on series 604 image 91 when compared to coronal CT reconstructions of the head dated 07/30/2020 and 03/05/2020. This finding is also visualized on the axial series (series 5 image 105). MRV may be obtained for further characterization if clinically indicated. CTA Neck: No cervical carotid or vertebral artery stenosis/occlusion or dissection   MRI Brain with and without contrast  1. No acute infarct, mass, or intracranial hemorrhage. 2. Signal abnormalities and enhancement along the paramedian left frontal lobe are most consistent with a large developmental venous anomaly. No arterial involvement or underlying vascular nidus is identified to suggest an arteriovenous malformation or dislocation. Mild FLAIR signal abnormality is present along the cortex of the paramedian left frontal lobe in this region, possibly reflecting a component of edema or gliosis. These findings could also reflect evidence of partial thrombosis of the DVA given the patient's presentation and marked hyperattenuation in  this region on prior CT. MRV or CTV imaging of the brain may allow for additional characterization. 3. The hippocampi are symmetric in size without abnormal intrinsic signal. There is no evidence for cortical dysplasia or gray matter heterotopia   MRV Brain  Superior sagittal sinus is normal. There is mildly tapered caliber of the transverse sinuses bilaterally, more pronounced on the left, without a filling defects to indicate thrombosis. The sigmoid sinuses are normal bilaterally. Internal cerebral veins  and straight sinus is normal. Right frontal lobe DVA   MRI Brain 11/27/2020 1.  No evidence of acute intracranial hemorrhage, infarction or mass.  2.  Large left frontal developmental venous anomaly, similar in appearance to prior imaging.  3.  Abnormalities in the periventricular and subcortical white matter are nonspecific.  Given the patient's age these findings likely represent mild chronic microvascular ischemic changes    ASSESSMENT AND PLAN  55 y.o. year old female with past medical history of diabetes mellitus (well controlled), obesity, hypertension and bipolar disorder who is presenting for follow up.  In term of her peripheral neuropathy I have recommended to continue duloxetine 30 mg twice daily but she has only been taking it in the morning.  I recommended to take 2 tabs in the morning instead of twice daily.  She is coming today with new complaint of right thigh shooting/electric pain and numbness which is different from her peripheral neuropathy.  She denies any previous history of back pain or back trauma but I suspect lumbar radiculopathy.  I will obtain a brain lumbar MRI and if "normal" I will send the patient for physical therapy otherwise I will refer her to a neurosurgeon if abnormal.     1. Lumbar radiculopathy   2. Neuropathy   3. Developmental venous anomaly, cerebral     PLAN: Continue with Duloxetine 60 mg (2 tabs in the morning)  MRI Lumbar spine without  contrast  Follow up as scheduled in 6 months   Orders Placed This Encounter  Procedures   MR LUMBAR SPINE WO CONTRAST     No orders of the defined types were placed in this encounter.   Return in about 6 months (around 11/04/2021).    Windell Norfolk, MD 05/07/2021, 10:20 AM  [facility] [address], [address] [place], [place] [address] [phone]

## 2021-05-13 ENCOUNTER — Telehealth: Payer: Self-pay | Admitting: Neurology

## 2021-05-13 ENCOUNTER — Other Ambulatory Visit: Payer: BC Managed Care – PPO

## 2021-05-13 NOTE — Telephone Encounter (Signed)
LVM for pt to call back to schedule  BCBS auth: [id number] (exp. 05/13/21 to 06/11/21)

## 2021-05-14 NOTE — Telephone Encounter (Signed)
Patient is scheduled at [facility] for 05/20/21.

## 2021-05-20 ENCOUNTER — Ambulatory Visit (INDEPENDENT_AMBULATORY_CARE_PROVIDER_SITE_OTHER): Payer: BC Managed Care – PPO

## 2021-05-20 DIAGNOSIS — M5416 Radiculopathy, lumbar region: Secondary | ICD-10-CM

## 2021-05-25 ENCOUNTER — Telehealth: Payer: Self-pay | Admitting: Neurology

## 2021-05-25 DIAGNOSIS — M5416 Radiculopathy, lumbar region: Secondary | ICD-10-CM

## 2021-05-25 NOTE — Telephone Encounter (Signed)
Spoke with patient, discussed MRI results showing 1.   At L4-L5, there is moderate spinal stenosis due to anterolisthesis, disc bulging, facet hypertrophy and left ligamenta flava hypertrophy.  There is moderate lateral recess stenosis, left greater than right that could affect the left L5 nerve root. 2.   Milder degenerative changes at L3-L4 and L5-S1 that do not lead to spinal stenosis or nerve root compression. I will send her to physical therapy, she is comfortable with plans.  Follow-up as scheduled.  Windell Norfolk, MD

## 2021-06-01 ENCOUNTER — Ambulatory Visit: Payer: BC Managed Care – PPO | Admitting: Physical Therapy

## 2021-06-11 ENCOUNTER — Ambulatory Visit: Payer: BC Managed Care – PPO | Admitting: Physical Therapy

## 2021-06-18 ENCOUNTER — Ambulatory Visit: Payer: BC Managed Care – PPO

## 2021-06-18 NOTE — Therapy (Deleted)
OUTPATIENT PHYSICAL THERAPY CERVICAL EVALUATION   Patient Name: Deanna Simpson MRN: [id number] DOB:1965-12-23, 55 y.o., female Today's Date: 06/18/2021    Past Medical History:  Diagnosis Date   Allergy    Complication of anesthesia    Histerics   Depression    Hyperlipemia    Interstitial cystitis    Past Surgical History:  Procedure Laterality Date   BREAST SURGERY     TUBAL LIGATION     Patient Active Problem List   Diagnosis Date Noted   Depression 08/10/2011   Muscle pain, fibromyalgia 08/10/2011   Hyperlipidemia 08/10/2011   Obesity 08/10/2011    PCP: Richardean Chimera, MD  REFERRING PROVIDER: Windell Norfolk, MD  REFERRING DIAG: M54.16 (ICD-10-CM) - Lumbar radiculopathy  THERAPY DIAG:  No diagnosis found.  ONSET DATE: ***  SUBJECTIVE:                                                                                                                                                                                                         SUBJECTIVE STATEMENT: ***  PERTINENT HISTORY:  ***  PAIN:  Are you having pain? {yes/no:20286} VAS scale: ***/10 Pain location: *** Pain orientation: {Pain Orientation:25161}  PAIN TYPE: {type:313116} Pain description: {PAIN DESCRIPTION:21022940}  Aggravating factors: *** Relieving factors: ***  PRECAUTIONS: {Therapy precautions:24002}  WEIGHT BEARING RESTRICTIONS {Yes ***/No:24003}  FALLS:  Has patient fallen in last 6 months? {yes/no:20286} Number of falls: ***  LIVING ENVIRONMENT: Lives with: {[facility] lives with:25569::"lives with their family"} Lives in: {Lives in:25570} Stairs: {yes/no:20286}; {Stairs:24000} Has following equipment at home: {Assistive devices:23999}  OCCUPATION: ***  PLOF: {PLOF:24004}  PATIENT GOALS ***  OBJECTIVE:   DIAGNOSTIC FINDINGS:  EXAM: MRI of the lumbar spine without contrast   ORDERING CLINICIAN: Windell Norfolk, MD CLINICAL HISTORY: 55 year old woman with lumbar  radiculopathy COMPARISON FILMS: None   TECHNIQUE: MRI of the lumbar spine was obtained utilizing 4 mm sagittal slices from T11-12 down to the lower sacrum with T1, T2 and inversion recovery views. In addition 4 mm axial slices from L1-2 down to L5-S1 level were included with T1 and T2 weighted views. CONTRAST: None IMAGING SITE: [facility], [address] [place], [place] [address]   FINDINGS: On sagittal images, the spine is imaged from T11 to the sacrum.   The conus medullaris and cauda equine appear normal.   There is 5 mm anterolisthesis of L4 upon L5 and 1 to 2 mm retrolisthesis of L5 upon S1.  There is a Schmorl's node entering the inferior endplate of T12.  The vertebral bodies have normal  marrow signal.     nnThe discs and interspaces were further evaluated on axial views from L1 to S1 as follows:   L1 - L2:  The disc and interspace appear normal.   L2 - L3:  The disc and interspace appear normal.   L3 - L4: There is mild disc bulging that does causing mild right foraminal narrowing but no spinal stenosis or nerve root compression..   L4 - L5: There is moderate spinal stenosis due to 5 mm anterolisthesis, bulging of the uncovered disc, facet hypertrophy and left ligamenta flava hypertrophy.  There is mild bilateral foraminal narrowing, and moderate bilateral lateral recess stenosis, left greater than right.  This could affect the left L5 nerve root.   L5 - S1: There is borderline retrolisthesis and mild disc bulging.  No spinal stenosis or nerve root compression.     IMPRESSION: This MRI of the lumbar spine without contrast shows the following: 1.   At L4-L5, there is moderate spinal stenosis due to anterolisthesis, disc bulging, facet hypertrophy and left ligamenta flava hypertrophy.  There is moderate lateral recess stenosis, left greater than right that could affect the left L5 nerve root. 2.   Milder degenerative changes at L3-L4 and L5-S1 that do not lead to  spinal stenosis or nerve root compression.    PATIENT SURVEYS:  {rehab surveys:24030}   COGNITION: Overall cognitive status: {cognition:24006}   SENSATION: Light touch: {intact/deficits:24005} Hot/Cold: {intact/deficits:24005} Proprioception: {intact/deficits:24005}  POSTURE:  ***  PALPATION: ***   CERVICAL AROM/PROM  A/PROM A/PROM (deg) 06/18/2021  Flexion   Extension   Right lateral flexion   Left lateral flexion   Right rotation   Left rotation    (Blank rows = not tested)  UE AROM/PROM:  A/PROM Right 06/18/2021 Left 06/18/2021  Shoulder flexion    Shoulder extension    Shoulder abduction    Shoulder adduction    Shoulder extension    Shoulder internal rotation    Shoulder external rotation    Elbow flexion    Elbow extension    Wrist flexion    Wrist extension    Wrist ulnar deviation    Wrist radial deviation    Wrist pronation    Wrist supination     (Blank rows = not tested)  UE MMT:  MMT Right 06/18/2021 Left 06/18/2021  Shoulder flexion    Shoulder extension    Shoulder abduction    Shoulder adduction    Shoulder extension    Middle trapezius    Lower trapezius    Elbow flexion    Elbow extension    Wrist flexion    Wrist extension    Wrist ulnar deviation    Wrist radial deviation    Wrist pronation    Wrist supination    Grip strength     (Blank rows = not tested)  CERVICAL SPECIAL TESTS:  {Cervical special tests:25246}   FUNCTIONAL TESTS:  {Functional tests:24029}    TODAY'S TREATMENT:  ***   PATIENT EDUCATION:  Education details: *** Person educated: {Person educated:25204} Education method: {Education Method:25205} Education comprehension: {Education Comprehension:25206}   HOME EXERCISE PROGRAM: ***  ASSESSMENT:  CLINICAL IMPRESSION: Patient is a *** y.o. *** who was seen today for physical therapy evaluation and treatment for ***. Objective impairments include {opptimpairments:25111}. These  impairments are limiting patient from {activity limitations:25113}. Personal factors including {Personal factors:25162} are also affecting patient's functional outcome. Patient will benefit from skilled PT to address above impairments and improve overall function.  REHAB  POTENTIAL: {rehabpotential:25112}  CLINICAL DECISION MAKING: {clinical decision making:25114}  EVALUATION COMPLEXITY: {Evaluation complexity:25115}   GOALS: Goals reviewed with patient? {yes/no:20286}  SHORT TERM GOALS:  STG Name Target Date Goal status  1 *** Baseline:  {follow up:25551} {GOALSTATUS:25110}  2 *** Baseline:  {follow up:25551} {GOALSTATUS:25110}  3 *** Baseline: {follow up:25551} {GOALSTATUS:25110}  4 *** Baseline: {follow up:25551} {GOALSTATUS:25110}  5 *** Baseline: {follow up:25551} {GOALSTATUS:25110}  6 *** Baseline: {follow up:25551} {GOALSTATUS:25110}  7 *** Baseline: {follow up:25551} {GOALSTATUS:25110}   LONG TERM GOALS:   LTG Name Target Date Goal status  1 *** Baseline: {follow up:25551} {GOALSTATUS:25110}  2 *** Baseline: {follow up:25551} {GOALSTATUS:25110}  3 *** Baseline: {follow up:25551} {GOALSTATUS:25110}  4 *** Baseline: {follow up:25551} {GOALSTATUS:25110}  5 *** Baseline: {follow up:25551} {GOALSTATUS:25110}  6 *** Baseline: {follow up:25551} {GOALSTATUS:25110}  7 *** Baseline: {follow up:25551} {GOALSTATUS:25110}   PLAN: PT FREQUENCY: {rehab frequency:25116}  PT DURATION: {rehab duration:25117}  PLANNED INTERVENTIONS: {rehab planned interventions:25118::"Therapeutic exercises","Therapeutic activity","Neuro Muscular re-education","Balance training","Gait training","Patient/Family education","Joint mobilization"}  PLAN FOR NEXT SESSION: Eloy End 06/18/2021, 11:45 AM

## 2021-07-01 ENCOUNTER — Ambulatory Visit (INDEPENDENT_AMBULATORY_CARE_PROVIDER_SITE_OTHER): Payer: BC Managed Care – PPO | Admitting: Neurology

## 2021-07-01 ENCOUNTER — Encounter: Payer: Self-pay | Admitting: Neurology

## 2021-07-01 VITALS — BP 124/84 | HR 80 | Ht 63.0 in | Wt 224.0 lb

## 2021-07-01 DIAGNOSIS — Q283 Other malformations of cerebral vessels: Secondary | ICD-10-CM

## 2021-07-01 DIAGNOSIS — R41840 Attention and concentration deficit: Secondary | ICD-10-CM

## 2021-07-01 DIAGNOSIS — G629 Polyneuropathy, unspecified: Secondary | ICD-10-CM | POA: Diagnosis not present

## 2021-07-01 NOTE — Patient Instructions (Signed)
Referral to psychiatry  Follow up as scheduled

## 2021-07-01 NOTE — Progress Notes (Signed)
[facility]  PATIENT: Deanna Simpson DOB: 31-Oct-1965  REFERRING CLINICIAN: Richardean Chimera, MD HISTORY FROM: Patient  REASON FOR VISIT: To establish care/ AVM/ Neuropathy    HISTORICAL  CHIEF COMPLAINT:  Chief Complaint  Patient presents with   Follow-up    Rm 13. Alone. Pt c/o acute memory issues starting around 2 weeks ago. Pt states she almost got in a car accident due to not being able to remember stop and go. She also states she lost her job due to not having a memory of being told what to do. MMSE 29/30.    INTERNAL HISTORY 07/01/2021 Patient present today for follow-up.  Last visit was on November 2022, time at that time she was complaining of lumbar radiculopathy.  We obtained a MRI lumbar spine which did not show any severe stenosis.  She was referred to physical therapy but has not completed it yet.  Today she is presenting with memory problem.  She describes memory problem as being forgetful, had difficulty with attention.  She reported in the past 4 months, she was making so many mistakes at work to the point that she lost her job. 2 weeks ago she almost got into a car accident because she confused the gas pedal with the brake pedal.  She describes her memory problem as being forgetful, forgetful about recent conversation, difficulty holding or focusing during her meetings at work.  And family also has reported she has been repeating the same thing over and over, she also misplace things.  Her husband handle finances, she reported her stress level is not too bad and she does not have any difficulty with remembering names of familiar people.  She does however report that lately she has been getting lost while driving in familiar places, she has to pull on the side and sometimes use a phone to get back on the road. Patient reports being diagnosed with ADD in the past, she was on Adderall but 74-month ago her primary care doctor discontinued Adderall.   INTERVAL  HISTORY 05/07/2021 Patient presents today for follow-up.  Last visit was on October 5, at that time we discussed about her AVM and bilateral feet numbness.  She is present today with new complaint of right thigh shooting pain, mostly anterior part of right thigh. Patient stated she was driving the other day and lost sensation/feeling of her right leg, episode lasted about 1 hour, on top of that she also noted when lifting her right leg up during exercise she will have a shooting pain.  Denies any previous back pain denies any previous back injury.  In terms of bilateral feet numbness patient stated the sensation is the same no improvement, she is supposed to take duloxetine 30 mg twice daily but she is only taking 30 mg in the morning, forgetting her evening dose.     HISTORY OF PRESENT ILLNESS:  This is a 56 year old woman with past medical history of diabetes mellitus type 2, obesity, bipolar disorder, peripheral neuropathy and AVM who is presenting to establish care.  Patient stated that in the month of February she she was admitted in the hospital in [place] after presenting with 4 episodes of difficulty with speech.  Patient said that she had difficulty with speech lasting less than 30 seconds, and after the fourth event, she presented to the ED.  She was also stating that the difficulty speaking was associated with body locking up.  She had a head CT and MRI showing AVM  in the left frontal lobe and plan was at that time to follow up with neurosurgery and neurology as outpatient.  Patient stated since February she has not had any additional episodes of difficulty speaking.   She also noted that in the beginning of the year she started having recurrent syncope, she said her last episode was in June.  At that time they thought the syncope was related to medications interaction, she had medications changed but was not told the etiology of her syncope.  Her last episode was in June.    She also has a  history of neuropathy previously was taking duloxetine 30 mg twice daily but with her recurrent syncopal episodes she has stopped the duloxetine.  She said during that time her neuropathy was worse but since there was improvement of her syncopal episodes she restarted the duloxetine and she noted some improvement in her neuropathy.  Currently she is back on Duloxetine 30 mg BID. She described as numbness on both feet localized on top of the toes and top of the foot.  Denies any pain denies any tingling sensation.  Denies any history of back pain.    OTHER MEDICAL CONDITIONS: Diabetes mellitus type 2 hypertension, obesity, bipolar disorder, peripheral neuropathy   REVIEW OF SYSTEMS: Full 14 system review of systems performed and negative with exception of: As noted in the HPI  ALLERGIES: Allergies  Allergen Reactions   Antihistamines, Loratadine-Type     Heart races.   Influenza Vaccine Live Swelling    Vaccine causes swelling and pain at injection site   Pneumococcal Vaccines Swelling    Causes swelling and pain at injection site.   Prednisone Other (See Comments)    Prednisone makes pt Suicidal/ psychotic. (Patient states she no longer has an intolerance)    HOME MEDICATIONS: Outpatient Medications Prior to Visit  Medication Sig Dispense Refill   ARIPiprazole (ABILIFY) 5 MG tablet Take 5 mg by mouth daily.     cholecalciferol (VITAMIN D) 1000 UNITS tablet Take 1,000 Units by mouth daily.     DULoxetine (CYMBALTA) 30 MG capsule Take 30 mg by mouth daily.     lamoTRIgine (LAMICTAL) 200 MG tablet Take 200 mg by mouth daily.     potassium chloride (KLOR-CON) 10 MEQ tablet Take 10 mEq by mouth daily.     rOPINIRole (REQUIP) 1 MG tablet Take 1 mg by mouth at bedtime.     No facility-administered medications prior to visit.    PAST MEDICAL HISTORY: Past Medical History:  Diagnosis Date   Allergy    Complication of anesthesia    Histerics   Depression    Hyperlipemia     Interstitial cystitis     PAST SURGICAL HISTORY: Past Surgical History:  Procedure Laterality Date   BREAST SURGERY     TUBAL LIGATION      FAMILY HISTORY: Family History  Problem Relation Age of Onset   Heart disease Mother        Mitral valve prolapse   Hyperlipidemia Father    Heart disease Brother    Heart disease Maternal Grandmother    Heart disease Maternal Grandfather     SOCIAL HISTORY: Social History   Socioeconomic History   Marital status: Married    Spouse name: Not on file   Number of children: 3   Years of education: Not on file   Highest education level: Not on file  Occupational History   Occupation: TEACHER    Employer: [employer]  Tobacco Use  Smoking status: Never   Smokeless tobacco: Never  Substance and Sexual Activity   Alcohol use: No   Drug use: No   Sexual activity: Yes    Birth control/protection: Surgical  Other Topics Concern   Not on file  Social History Narrative   Not on file   Social Determinants of Health   Financial Resource Strain: Not on file  Food Insecurity: Not on file  Transportation Needs: Not on file  Physical Activity: Not on file  Stress: Not on file  Social Connections: Not on file  Intimate Partner Violence: Not on file     PHYSICAL EXAM  GENERAL EXAM/CONSTITUTIONAL: Vitals:  Vitals:   07/01/21 1430  BP: 124/84  Pulse: 80  Weight: 224 lb (101.6 kg)  Height: 5\' 3"  (1.6 m)   Body mass index is 39.68 kg/m. Wt Readings from Last 3 Encounters:  07/01/21 224 lb (101.6 kg)  05/07/21 223 lb (101.2 kg)  04/01/21 232 lb (105.2 kg)   Patient is in no distress; well developed, nourished and groomed; neck is supple  EYES: Pupils round and reactive to light, Visual fields full to confrontation, Extraocular movements intacts,   MUSCULOSKELETAL: Gait, strength, tone, movements noted in Neurologic exam below  NEUROLOGIC: MENTAL STATUS:  MMSE - Mini Mental State Exam 07/01/2021  Orientation to  time 4  Orientation to Place 5  Registration 3  Attention/ Calculation 5  Recall 3  Language- name 2 objects 2  Language- repeat 1  Language- follow 3 step command 3  Language- read & follow direction 1  Write a sentence 1  Copy design 1  Total score 29    CRANIAL NERVE:  2nd, 3rd, 4th, 6th - pupils equal and reactive to light, visual fields full to confrontation, extraocular muscles intact, no nystagmus 5th - facial sensation symmetric 7th - facial strength symmetric 8th - hearing intact 9th - palate elevates symmetrically, uvula midline 11th - shoulder shrug symmetric 12th - tongue protrusion midline  MOTOR:  normal bulk and tone, full strength in the BUE, BLE  SENSORY:  Decrease pinprick and vibration in both feet up to ankle.   COORDINATION:  finger-nose-finger, fine finger movements normal  REFLEXES:  deep tendon reflexes present and symmetric  GAIT/STATION:  normal   DIAGNOSTIC DATA (LABS, IMAGING, TESTING) - I reviewed patient records, labs, notes, testing and imaging myself where available.  Lab Results  Component Value Date   WBC 5.9 08/30/2013   HGB 13.6 08/30/2013   HCT 40.1 08/30/2013   MCV 86.3 08/30/2013   PLT 271.0 08/30/2013      Component Value Date/Time   NA 137 08/30/2013 1003   K 4.0 08/30/2013 1003   CL 104 08/30/2013 1003   CO2 25 08/30/2013 1003   GLUCOSE 87 08/30/2013 1003   BUN 10 08/30/2013 1003   CREATININE 0.9 08/30/2013 1003   CREATININE 0.79 08/28/2012 1229   CALCIUM 9.0 08/30/2013 1003   PROT 7.0 08/30/2013 1003   ALBUMIN 4.0 08/30/2013 1003   AST 18 08/30/2013 1003   ALT 14 08/30/2013 1003   ALKPHOS 47 08/30/2013 1003   BILITOT 0.5 08/30/2013 1003   GFRNONAA 86 (L) 07/20/2013 2245   GFRAA >90 07/20/2013 2245   No results found for: CHOL, HDL, LDLCALC, LDLDIRECT, TRIG, CHOLHDL No results found for: [id number] No results found for: VITAMINB12 No results found for: TSH  CT Head 08/21/2020 1. Findings suggesting a high  left frontal convexity developmental venous malformation (DVM), which can be confirmed with gadolinium-enhanced  MRI. This is more conspicuous on the present examination, however, is likely unchanged in the interval. 2. No acute intracranial pathology evident.   CTA Head and Neck  1. No intracranial proximal arterial stenosis/occlusion or aneurysm. 2. Branching venous structure in the paramedian left frontal lobe likely reflects a developmental venous anomaly. There is concern for thrombosis along the major draining vein as it extends from the cortex to the left aspect of the interhemispheric falx cerebri. This finding is best visualized on series 604 image 91 when compared to coronal CT reconstructions of the head dated 07/30/2020 and 03/05/2020. This finding is also visualized on the axial series (series 5 image 105). MRV may be obtained for further characterization if clinically indicated. CTA Neck: No cervical carotid or vertebral artery stenosis/occlusion or dissection   MRI Brain with and without contrast  1. No acute infarct, mass, or intracranial hemorrhage. 2. Signal abnormalities and enhancement along the paramedian left frontal lobe are most consistent with a large developmental venous anomaly. No arterial involvement or underlying vascular nidus is identified to suggest an arteriovenous malformation or dislocation. Mild FLAIR signal abnormality is present along the cortex of the paramedian left frontal lobe in this region, possibly reflecting a component of edema or gliosis. These findings could also reflect evidence of partial thrombosis of the DVA given the patient's presentation and marked hyperattenuation in this region on prior CT. MRV or CTV imaging of the brain may allow for additional characterization. 3. The hippocampi are symmetric in size without abnormal intrinsic signal. There is no evidence for cortical dysplasia or gray matter heterotopia   MRV Brain  Superior sagittal sinus is  normal. There is mildly tapered caliber of the transverse sinuses bilaterally, more pronounced on the left, without a filling defects to indicate thrombosis. The sigmoid sinuses are normal bilaterally. Internal cerebral veins and straight sinus is normal. Right frontal lobe DVA   MRI Brain 11/27/2020 1.  No evidence of acute intracranial hemorrhage, infarction or mass.  2.  Large left frontal developmental venous anomaly, similar in appearance to prior imaging.  3.  Abnormalities in the periventricular and subcortical white matter are nonspecific.  Given the patient's age these findings likely represent mild chronic microvascular ischemic changes   Lumbar spine MRI 1.   At L4-L5, there is moderate spinal stenosis due to anterolisthesis, disc bulging, facet hypertrophy and left ligamenta flava hypertrophy.  There is moderate lateral recess stenosis, left greater than right that could affect the left L5 nerve root. 2.   Milder degenerative changes at L3-L4 and L5-S1 that do not lead to spinal stenosis or nerve root compression.    ASSESSMENT AND PLAN  56 y.o. year old female with past medical history of diabetes mellitus (well controlled), obesity, hypertension and bipolar disorder who is presenting for new concern of memory problems mainly described as difficulty concentrating.  There is also report of asking the same questions over and over and difficulty finishing task.  Patient previously carried a diagnosis of ADD, was on Adderall but in June her primary care doctor discontinued it.  It seems after discontinuation of Adderall her symptoms started 7179-month ago to the point of losing her job.  At this point, I think is reasonable to refer her to psychiatry for management of her ADD and Bipolar disorder.  In terms of her neuropathy and radiculopathy I have advised her to continue to take the duloxetine and to start physical therapy, she has not started physical therapy yet.  I will follow-up  as  scheduled in 6 months.    1. Attention deficit   2. Developmental venous anomaly, cerebral   3. Neuropathy     Patient Instructions  Referral to psychiatry  Follow up as scheduled     No orders of the defined types were placed in this encounter.    No orders of the defined types were placed in this encounter.    No follow-ups on file.    Windell Norfolk, MD 07/01/2021, 4:56 PM  [facility] [address], [address] [place], [place] [address] [phone]

## 2021-07-02 ENCOUNTER — Telehealth: Payer: Self-pay | Admitting: Neurology

## 2021-07-02 NOTE — Telephone Encounter (Signed)
Referral has been sent to  [facility]. Phone: [phone]. °

## 2021-09-08 ENCOUNTER — Telehealth: Payer: Self-pay | Admitting: Neurology

## 2021-09-08 MED ORDER — DULOXETINE HCL 30 MG PO CPEP: 30.0000 mg | ORAL_CAPSULE | Freq: Every day | ORAL | 1 refills | Status: DC

## 2021-09-08 NOTE — Telephone Encounter (Signed)
As per last office visit note with Dr. Teresa Coombs "In terms of her neuropathy and radiculopathy I have advised her to continue to take the duloxetine." Will send rx for cosign. ?

## 2021-09-08 NOTE — Telephone Encounter (Signed)
Pt would like to know if Dr Teresa Coombs will manage her DULoxetine (CYMBALTA) 30 MG capsule, if so pt would like to have that called into [facility] please call  ?

## 2021-09-08 NOTE — Telephone Encounter (Signed)
Done. Thanks.

## 2021-09-29 ENCOUNTER — Other Ambulatory Visit: Payer: Self-pay | Admitting: Neurology

## 2021-10-01 ENCOUNTER — Ambulatory Visit: Payer: BC Managed Care – PPO | Admitting: Neurology

## 2021-10-01 NOTE — Telephone Encounter (Signed)
Rx request sent

## 2021-10-03 ENCOUNTER — Other Ambulatory Visit: Payer: Self-pay | Admitting: Neurology

## 2021-10-05 ENCOUNTER — Encounter: Payer: Self-pay | Admitting: Neurology

## 2021-10-05 ENCOUNTER — Ambulatory Visit: Payer: 59 | Admitting: Neurology

## 2021-10-05 VITALS — BP 146/91 | HR 69 | Ht 63.0 in | Wt 231.5 lb

## 2021-10-05 DIAGNOSIS — G619 Inflammatory polyneuropathy, unspecified: Secondary | ICD-10-CM | POA: Insufficient documentation

## 2021-10-05 DIAGNOSIS — F988 Other specified behavioral and emotional disorders with onset usually occurring in childhood and adolescence: Secondary | ICD-10-CM | POA: Insufficient documentation

## 2021-10-05 DIAGNOSIS — G629 Polyneuropathy, unspecified: Secondary | ICD-10-CM | POA: Diagnosis not present

## 2021-10-05 DIAGNOSIS — Q283 Other malformations of cerebral vessels: Secondary | ICD-10-CM | POA: Diagnosis not present

## 2021-10-05 DIAGNOSIS — R41840 Attention and concentration deficit: Secondary | ICD-10-CM | POA: Diagnosis not present

## 2021-10-05 MED ORDER — MELOXICAM 15 MG PO TABS: 15.0000 mg | ORAL_TABLET | Freq: Every day | ORAL | 0 refills | Status: DC

## 2021-10-05 NOTE — Patient Instructions (Signed)
Continue with current medications  ?Please have B12 level and TSH check at your next doctor appointment  ?Return in 6 months  ?

## 2021-10-05 NOTE — Progress Notes (Signed)
? ?[facility] ? ?PATIENT: Deanna Simpson ?DOB: 12-Oct-1965 ? ?REFERRING CLINICIAN: Richardean Chimera, MD ?HISTORY FROM: Patient  ?REASON FOR VISIT: To establish care/ AVM/ Neuropathy  ? ? ?HISTORICAL ? ?CHIEF COMPLAINT:  ?Chief Complaint  ?Patient presents with  ? Follow-up  ?  Rm 14. Alone. ?Pt states she feels like memory and attention have improved since new job. C/o neuropathy exterior of both knees, ankle down on both legs. ?Pt requesting duloxetine and meloxicam refills.  ? ?INTERVAL HISTORY 10/05/2021 ?Patient presents today for follow-up.  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  She has an appointment with the [place] [facility] scheduled this month on the 27th.  Reported numbness in the lower extremity still present, no worsening of her symptoms.  No other complaint, no other issues.  ? ? ?INTERNAL HISTORY 07/01/2021 ?Patient present today for follow-up.  Last visit was on November 2022, time at that time she was complaining of lumbar radiculopathy.  We obtained a MRI lumbar spine which did not show any severe stenosis.  She was referred to physical therapy but has not completed it yet.  Today she is presenting with memory problem.  She describes memory problem as being forgetful, had difficulty with attention.  She reported in the past 4 months, she was making so many mistakes at work to the point that she lost her job. 2 weeks ago she almost got into a car accident because she confused the gas pedal with the brake pedal.  She describes her memory problem as being forgetful, forgetful about recent conversation, difficulty holding or focusing during her meetings at work.  And family also has reported she has been repeating the same thing over and over, she also misplace things.  Her husband handle finances, she reported her stress level is not too bad and she does not have any difficulty with remembering names of familiar people.  She  does however report that lately she has been getting lost while driving in familiar places, she has to pull on the side and sometimes use a phone to get back on the road. ?Patient reports being diagnosed with ADD in the past, she was on Adderall but 73-month ago her primary care doctor discontinued Adderall. ? ? ?INTERVAL HISTORY 05/07/2021 ?Patient presents today for follow-up.  Last visit was on October 5, at that time we discussed about her AVM and bilateral feet numbness.  She is present today with new complaint of right thigh shooting pain, mostly anterior part of right thigh. Patient stated she was driving the other day and lost sensation/feeling of her right leg, episode lasted about 1 hour, on top of that she also noted when lifting her right leg up during exercise she will have a shooting pain.  Denies any previous back pain denies any previous back injury.  In terms of bilateral feet numbness patient stated the sensation is the same no improvement, she is supposed to take duloxetine 30 mg twice daily but she is only taking 30 mg in the morning, forgetting her evening dose.   ? ? ?HISTORY OF PRESENT ILLNESS:  ?This is a 56 year old woman with past medical history of diabetes mellitus type 2, obesity, bipolar disorder, peripheral neuropathy and AVM who is presenting to establish care.  Patient stated that in the month of February she she was admitted in the hospital in [place] after presenting with 4 episodes of difficulty with speech.  Patient said that she had difficulty  with speech lasting less than 30 seconds, and after the fourth event, she presented to the ED.  She was also stating that the difficulty speaking was associated with body locking up.  She had a head CT and MRI showing AVM in the left frontal lobe and plan was at that time to follow up with neurosurgery and neurology as outpatient.  Patient stated since February she has not had any additional episodes of difficulty speaking.   ?She also  noted that in the beginning of the year she started having recurrent syncope, she said her last episode was in June.  At that time they thought the syncope was related to medications interaction, she had medications changed but was not told the etiology of her syncope.  Her last episode was in June.   ? ?She also has a history of neuropathy previously was taking duloxetine 30 mg twice daily but with her recurrent syncopal episodes she has stopped the duloxetine.  She said during that time her neuropathy was worse but since there was improvement of her syncopal episodes she restarted the duloxetine and she noted some improvement in her neuropathy.  Currently she is back on Duloxetine 30 mg BID. She described as numbness on both feet localized on top of the toes and top of the foot.  Denies any pain denies any tingling sensation.  Denies any history of back pain. ? ? ? ?OTHER MEDICAL CONDITIONS: Diabetes mellitus type 2 hypertension, obesity, bipolar disorder, peripheral neuropathy ? ? ?REVIEW OF SYSTEMS: Full 14 system review of systems performed and negative with exception of: As noted in the HPI ? ?ALLERGIES: ?Allergies  ?Allergen Reactions  ? Antihistamines, Loratadine-Type   ?  Heart races.  ? Influenza Vaccine Live Swelling  ?  Vaccine causes swelling and pain at injection site  ? Pneumococcal Vaccines Swelling  ?  Causes swelling and pain at injection site.  ? Prednisone Other (See Comments)  ?  Prednisone makes pt Suicidal/ psychotic. ?(Patient states she no longer has an intolerance)  ? ? ?HOME MEDICATIONS: ?Outpatient Medications Prior to Visit  ?Medication Sig Dispense Refill  ? ARIPiprazole (ABILIFY) 5 MG tablet Take 5 mg by mouth daily.    ? cholecalciferol (VITAMIN D) 1000 UNITS tablet Take 1,000 Units by mouth daily.    ? DULoxetine (CYMBALTA) 30 MG capsule Take 1 capsule (30 mg total) by mouth daily. 90 capsule 1  ? lamoTRIgine (LAMICTAL) 200 MG tablet TAKE 1 TABLET BY MOUTH DAILY 60 tablet 0  ?  potassium chloride (KLOR-CON) 10 MEQ tablet Take 10 mEq by mouth daily.    ? rOPINIRole (REQUIP) 1 MG tablet Take 1 mg by mouth at bedtime.    ? meloxicam (MOBIC) 15 MG tablet Take 15 mg by mouth daily.    ? ?No facility-administered medications prior to visit.  ? ? ?PAST MEDICAL HISTORY: ?Past Medical History:  ?Diagnosis Date  ? Allergy   ? Complication of anesthesia   ? Histerics  ? Depression   ? Hyperlipemia   ? Interstitial cystitis   ? ? ?PAST SURGICAL HISTORY: ?Past Surgical History:  ?Procedure Laterality Date  ? BREAST SURGERY    ? TUBAL LIGATION    ? ? ?FAMILY HISTORY: ?Family History  ?Problem Relation Age of Onset  ? Heart disease Mother   ?     Mitral valve prolapse  ? Hyperlipidemia Father   ? Heart disease Brother   ? Heart disease Maternal Grandmother   ? Heart disease Maternal Grandfather   ? ? ?  SOCIAL HISTORY: ?Social History  ? ?Socioeconomic History  ? Marital status: Married  ?  Spouse name: Not on file  ? Number of children: 3  ? Years of education: Not on file  ? Highest education level: Not on file  ?Occupational History  ? Occupation: TEACHER  ?  Employer: [employer]  ?Tobacco Use  ? Smoking status: Never  ? Smokeless tobacco: Never  ?Substance and Sexual Activity  ? Alcohol use: No  ? Drug use: No  ? Sexual activity: Yes  ?  Birth control/protection: Surgical  ?Other Topics Concern  ? Not on file  ?Social History Narrative  ? Not on file  ? ?Social Determinants of Health  ? ?Financial Resource Strain: Not on file  ?Food Insecurity: Not on file  ?Transportation Needs: Not on file  ?Physical Activity: Not on file  ?Stress: Not on file  ?Social Connections: Not on file  ?Intimate Partner Violence: Not on file  ? ? ? ?PHYSICAL EXAM ? ?GENERAL EXAM/CONSTITUTIONAL: ?Vitals:  ?Vitals:  ? 10/05/21 1002  ?BP: (!) 146/91  ?Pulse: 69  ?Weight: 231 lb 8 oz (105 kg)  ?Height: 5\' 3"  (1.6 m)  ? ?Body mass index is 41.01 kg/m?. ?Wt Readings from Last 3 Encounters:  ?10/05/21 231 lb 8 oz (105 kg)   ?07/01/21 224 lb (101.6 kg)  ?05/07/21 223 lb (101.2 kg)  ? ?Patient is in no distress; well developed, nourished and groomed; neck is supple ? ?EYES: ?Pupils round and reactive to light, Visual fields full to con

## 2021-10-07 ENCOUNTER — Other Ambulatory Visit: Payer: Self-pay | Admitting: Neurology

## 2021-10-19 ENCOUNTER — Ambulatory Visit (INDEPENDENT_AMBULATORY_CARE_PROVIDER_SITE_OTHER): Payer: 59

## 2021-10-19 ENCOUNTER — Ambulatory Visit: Payer: 59 | Admitting: Podiatry

## 2021-10-19 ENCOUNTER — Telehealth: Payer: Self-pay | Admitting: Neurology

## 2021-10-19 DIAGNOSIS — M79671 Pain in right foot: Secondary | ICD-10-CM | POA: Diagnosis not present

## 2021-10-19 DIAGNOSIS — M7731 Calcaneal spur, right foot: Secondary | ICD-10-CM | POA: Diagnosis not present

## 2021-10-19 DIAGNOSIS — M7661 Achilles tendinitis, right leg: Secondary | ICD-10-CM | POA: Diagnosis not present

## 2021-10-19 MED ORDER — METHYLPREDNISOLONE 4 MG PO TBPK
ORAL_TABLET | ORAL | 0 refills | Status: DC
Start: 2021-10-19 — End: 2021-11-13

## 2021-10-19 NOTE — Progress Notes (Signed)
Subjective:  ? ?Patient ID: Deanna Simpson, female   DOB: 56 y.o.   MRN: [id number]  ? ?HPI ?56 year old female presents the office with concerns of right heel pain.  She states the pain is all the time.  Started out 2.5 months ago.  No injuries that she reports.  She does have neuropathy but she is not reporting any worsening numbness or tingling or burning.  She tried icing.  She is also been on meloxicam for her knees and she did not notice any improvement into her heel.  No significant swelling that she reports.  She states that when she wears a shoe with a heel does not hurt nearly as bad.  No other concerns. ? ? ?Review of Systems  ?All other systems reviewed and are negative. ? ?Past Medical History:  ?Diagnosis Date  ? Allergy   ? Complication of anesthesia   ? Histerics  ? Depression   ? Hyperlipemia   ? Interstitial cystitis   ? ? ?Past Surgical History:  ?Procedure Laterality Date  ? BREAST SURGERY    ? TUBAL LIGATION    ? ? ? ?Current Outpatient Medications:  ?  methylPREDNISolone (MEDROL DOSEPAK) 4 MG TBPK tablet, Take as directed, Disp: 21 tablet, Rfl: 0 ?  ARIPiprazole (ABILIFY) 5 MG tablet, Take 5 mg by mouth daily., Disp: , Rfl:  ?  cholecalciferol (VITAMIN D) 1000 UNITS tablet, Take 1,000 Units by mouth daily., Disp: , Rfl:  ?  DULoxetine (CYMBALTA) 30 MG capsule, Take 1 capsule (30 mg total) by mouth daily., Disp: 90 capsule, Rfl: 1 ?  lamoTRIgine (LAMICTAL) 200 MG tablet, TAKE 1 TABLET BY MOUTH DAILY, Disp: 60 tablet, Rfl: 0 ?  meloxicam (MOBIC) 15 MG tablet, Take 1 tablet (15 mg total) by mouth daily., Disp: 30 tablet, Rfl: 0 ?  potassium chloride (KLOR-CON) 10 MEQ tablet, Take 10 mEq by mouth daily., Disp: , Rfl:  ?  rOPINIRole (REQUIP) 1 MG tablet, Take 1 mg by mouth at bedtime., Disp: , Rfl:  ? ?Allergies  ?Allergen Reactions  ? Antihistamines, Loratadine-Type   ?  Heart races.  ? Influenza Vaccine Live Swelling  ?  Vaccine causes swelling and pain at injection site  ? Pneumococcal Vaccines  Swelling  ?  Causes swelling and pain at injection site.  ? Prednisone Other (See Comments)  ?  Prednisone makes pt Suicidal/ psychotic. ?(Patient states she no longer has an intolerance)  ? ? ? ? ? ?   ?Objective:  ?Physical Exam  ?General: AAO x3, NAD ? ?Dermatological: Skin is warm, dry and supple bilateral.  There are no open sores, no preulcerative lesions, no rash or signs of infection present. ? ?Vascular: Dorsalis Pedis artery and Posterior Tibial artery pedal pulses are 2/4 bilateral with immedate capillary fill time. There is no pain with calf compression, swelling, warmth, erythema.  ? ?Neruologic: Grossly intact via light touch bilateral.   ? ?Musculoskeletal: There is tenderness palpation on the posterior aspect the calcaneus on the area of prominent bone spur.  Tenderness of the distal portion Achilles tendon on the insertion of the calcaneus.  There is no pain with lateral compression of the calcaneus.  Achilles tendon appears to be intact.  There is no other area discomfort noted.  Muscular strength 5/5 in all groups tested bilateral. ? ?Gait: Unassisted, Nonantalgic.  ? ? ?   ?Assessment:  ? ?56 year old female with insertional Achilles tendinitis, posterior heel spur right side ? ?   ?Plan:  ?-Treatment options  discussed including all alternatives, risks, and complications ?-Etiology of symptoms were discussed ?-X-rays were obtained and reviewed with the patient.  3 views of the right foot were obtained.  Arthritic changes present the first MPJ with history of prior bunion surgery noted.  Posterior calcaneal spurring is evident. ?-Prescribed Medrol Dosepak.  She previously had intolerance when she was much younger but she has been on prednisone 2 times since then without any side effects or issues. ?-Once the steroids are complete she can restart the meloxicam. ?-Icing daily. ?-Discussed stretch exercises daily. ?-Dispensed night splint to help stretch the Achilles.  ?-Heel lift ? ?Trula Slade DPM ? ?   ? ? ?

## 2021-10-19 NOTE — Patient Instructions (Signed)
For instructions on how to put on your Night Splint, please visit [email or web address] ? ?Achilles Tendinitis  ?with Rehab ?Achilles tendinitis is a disorder of the Achilles tendon. The Achilles tendon connects the large calf muscles (Gastrocnemius and Soleus) to the heel bone (calcaneus). This tendon is sometimes called the heel cord. It is important for pushing-off and standing on your toes and is important for walking, running, or jumping. Tendinitis is often caused by overuse and repetitive microtrauma. ?SYMPTOMS ?Pain, tenderness, swelling, warmth, and redness may occur over the Achilles tendon even at rest. ?Pain with pushing off, or flexing or extending the ankle. ?Pain that is worsened after or during activity. ?CAUSES  ?Overuse sometimes seen with rapid increase in exercise programs or in sports requiring running and jumping. ?Poor physical conditioning (strength and flexibility or endurance). ?Running sports, especially training running down hills. ?Inadequate warm-up before practice or play or failure to stretch before participation. ?Injury to the tendon. ?PREVENTION  ?Warm up and stretch before practice or competition. ?Allow time for adequate rest and recovery between practices and competition. ?Keep up conditioning. ?Keep up ankle and leg flexibility. ?Improve or keep muscle strength and endurance. ?Improve cardiovascular fitness. ?Use proper technique. ?Use proper equipment (shoes, skates). ?To help prevent recurrence, taping, protective strapping, or an adhesive bandage may be recommended for several weeks after healing is complete. ?PROGNOSIS  ?Recovery may take weeks to several months to heal. ?Longer recovery is expected if symptoms have been prolonged. ?Recovery is usually quicker if the inflammation is due to a direct blow as compared with overuse or sudden strain. ?RELATED COMPLICATIONS  ?Healing time will be prolonged if the condition is not correctly treated. The injury must be given  plenty of time to heal. ?Symptoms can reoccur if activity is resumed too soon. ?Untreated, tendinitis may increase the risk of tendon rupture requiring additional time for recovery and possibly surgery. ?TREATMENT  ?The first treatment consists of rest anti-inflammatory medication, and ice to relieve the pain. ?Stretching and strengthening exercises after resolution of pain will likely help reduce the risk of recurrence. Referral to a physical therapist or athletic trainer for further evaluation and treatment may be helpful. ?A walking boot or cast may be recommended to rest the Achilles tendon. This can help break the cycle of inflammation and microtrauma. ?Arch supports (orthotics) may be prescribed or recommended by your caregiver as an adjunct to therapy and rest. ?Surgery to remove the inflamed tendon lining or degenerated tendon tissue is rarely necessary and has shown less than predictable results. ?MEDICATION  ?Nonsteroidal anti-inflammatory medications, such as aspirin and ibuprofen, may be used for pain and inflammation relief. Do not take within 7 days before surgery. Take these as directed by your caregiver. Contact your caregiver immediately if any bleeding, stomach upset, or signs of allergic reaction occur. Other minor pain relievers, such as acetaminophen, may also be used. ?Pain relievers may be prescribed as necessary by your caregiver. Do not take prescription pain medication for longer than 4 to 7 days. Use only as directed and only as much as you need. ?Cortisone injections are rarely indicated. Cortisone injections may weaken tendons and predispose to rupture. It is better to give the condition more time to heal than to use them. ?HEAT AND COLD ?Cold is used to relieve pain and reduce inflammation for acute and chronic Achilles tendinitis. Cold should be applied for 10 to 15 minutes every 2 to 3 hours for inflammation and pain and immediately after any activity that aggravates your symptoms.    Use ice packs or an ice massage. ?Heat may be used before performing stretching and strengthening activities prescribed by your caregiver. Use a heat pack or a warm soak. ?SEEK MEDICAL CARE IF: ?Symptoms get worse or do not improve in 2 weeks despite treatment. ?New, unexplained symptoms develop. Drugs used in treatment may produce side effects. ? ?EXERCISES: ? ?RANGE OF MOTION (ROM) AND STRETCHING EXERCISES - Achilles Tendinitis  ?These exercises may help you when beginning to rehabilitate your injury. Your symptoms may resolve with or without further involvement from your physician, physical therapist or athletic trainer. While completing these exercises, remember:  ?Restoring tissue flexibility helps normal motion to return to the joints. This allows healthier, less painful movement and activity. ?An effective stretch should be held for at least 30 seconds. ?A stretch should never be painful. You should only feel a gentle lengthening or release in the stretched tissue. ? ?STRETCH  Gastroc, Standing  ?Place hands on wall. ?Extend right / left leg, keeping the front knee somewhat bent. ?Slightly point your toes inward on your back foot. ?Keeping your right / left heel on the floor and your knee straight, shift your weight toward the wall, not allowing your back to arch. ?You should feel a gentle stretch in the right / left calf. Hold this position for 10 seconds. ?Repeat 3 times. Complete this stretch 2 times per day. ? ?STRETCH  Soleus, Standing  ?Place hands on wall. ?Extend right / left leg, keeping the other knee somewhat bent. ?Slightly point your toes inward on your back foot. ?Keep your right / left heel on the floor, bend your back knee, and slightly shift your weight over the back leg so that you feel a gentle stretch deep in your back calf. ?Hold this position for 10 seconds. ?Repeat 3 times. Complete this stretch 2 times per day. ? ?STRETCH  Gastrocsoleus, Standing  ?Note: This exercise can place a lot  of stress on your foot and ankle. Please complete this exercise only if specifically instructed by your caregiver.  ?Place the ball of your right / left foot on a step, keeping your other foot firmly on the same step. ?Hold on to the wall or a rail for balance. ?Slowly lift your other foot, allowing your body weight to press your heel down over the edge of the step. ?You should feel a stretch in your right / left calf. ?Hold this position for 10 seconds. ?Repeat this exercise with a slight bend in your knee. ?Repeat 3 times. Complete this stretch 2 times per day.  ? ?STRENGTHENING EXERCISES - Achilles Tendinitis ?These exercises may help you when beginning to rehabilitate your injury. They may resolve your symptoms with or without further involvement from your physician, physical therapist or athletic trainer. While completing these exercises, remember:  ?Muscles can gain both the endurance and the strength needed for everyday activities through controlled exercises. ?Complete these exercises as instructed by your physician, physical therapist or athletic trainer. Progress the resistance and repetitions only as guided. ?You may experience muscle soreness or fatigue, but the pain or discomfort you are trying to eliminate should never worsen during these exercises. If this pain does worsen, stop and make certain you are following the directions exactly. If the pain is still present after adjustments, discontinue the exercise until you can discuss the trouble with your clinician. ? ?STRENGTH - Plantar-flexors  ?Sit with your right / left leg extended. Holding onto both ends of a rubber exercise band/tubing, loop it around   the ball of your foot. Keep a slight tension in the band. ?Slowly push your toes away from you, pointing them downward. ?Hold this position for 10 seconds. Return slowly, controlling the tension in the band/tubing. ?Repeat 3 times. Complete this exercise 2 times per day.  ? ?STRENGTH - Plantar-flexors   ?Stand with your feet shoulder width apart. Steady yourself with a wall or table using as little support as needed. ?Keeping your weight evenly spread over the width of your feet, rise up on your toes.* ?

## 2021-10-19 NOTE — Telephone Encounter (Signed)
I spoke to the patient. Reports having two syncopal episodes on 10/13/21 and 10/17/21. First event, she was walking up to her home, started having a tingling sensation in head and her vision went dark. She did not pass out and was aware the entire time. Event lasted about 3-4 seconds. The second time, she started with the same tingling feeling in head, moved down her body. She was walking in her bedroom and threw herself on the bed. Loss of consciousness for a few seconds. The last similar episode occurred in June 2022. At that time, she was taken off hydrochlorothiazide and did not have further issues. It was felt to be medication related. No recent med changes. She did mention being under a lot of stress currently. ? ?States this was discussed at her recent office visit on 10/05/21 but it was not the sole focus since it seemed to be resolved. She is asking if any tests should be order for further evaluation of problems.  ? ? ? ?

## 2021-10-19 NOTE — Telephone Encounter (Addendum)
Left message for a return call.  ? ?When she calls back, please provide her with Dr. Bonnita Hollow response. We will need to schedule her the next available appt w/ Dr. Teresa Coombs so he may do a more focused exam dealing with this specific concern. If she has additional questions, we will be happy to speak with her further.  ?

## 2021-10-19 NOTE — Telephone Encounter (Signed)
Pt states she is having a reoccurrence of symptoms she had one year ago, when pt was hospitalized.  ?Pt is fainting, not having seizures right now but they did occur last year.This started Tuesday 10/13/2021. Would like a call back. ?

## 2021-10-20 NOTE — Telephone Encounter (Signed)
I spoke to the patient. Offered several appt options over the next couple of weeks. She accepted 11/11/21 at 3:15pm. She will check-in at 2:45pm. ?

## 2021-10-27 ENCOUNTER — Other Ambulatory Visit: Payer: Self-pay | Admitting: Podiatry

## 2021-10-27 DIAGNOSIS — M7731 Calcaneal spur, right foot: Secondary | ICD-10-CM

## 2021-11-05 ENCOUNTER — Other Ambulatory Visit (HOSPITAL_COMMUNITY): Payer: Self-pay | Admitting: Otolaryngology

## 2021-11-05 ENCOUNTER — Other Ambulatory Visit: Payer: Self-pay | Admitting: Otolaryngology

## 2021-11-05 DIAGNOSIS — M25571 Pain in right ankle and joints of right foot: Secondary | ICD-10-CM

## 2021-11-06 ENCOUNTER — Ambulatory Visit (HOSPITAL_COMMUNITY): Payer: 59

## 2021-11-10 ENCOUNTER — Ambulatory Visit (HOSPITAL_COMMUNITY): Payer: 59

## 2021-11-10 ENCOUNTER — Encounter (HOSPITAL_COMMUNITY): Payer: Self-pay

## 2021-11-11 ENCOUNTER — Institutional Professional Consult (permissible substitution): Payer: 59 | Admitting: Neurology

## 2021-11-11 ENCOUNTER — Ambulatory Visit (HOSPITAL_COMMUNITY): Payer: 59

## 2021-11-12 ENCOUNTER — Ambulatory Visit (HOSPITAL_BASED_OUTPATIENT_CLINIC_OR_DEPARTMENT_OTHER)
Admission: RE | Admit: 2021-11-12 | Discharge: 2021-11-12 | Disposition: A | Discharge status: Home / Self Care | Payer: 59 | Source: Ambulatory Visit | Attending: Otolaryngology | Admitting: Otolaryngology

## 2021-11-12 DIAGNOSIS — S86011A Strain of right Achilles tendon, initial encounter: Secondary | ICD-10-CM | POA: Diagnosis not present

## 2021-11-12 DIAGNOSIS — M25571 Pain in right ankle and joints of right foot: Secondary | ICD-10-CM | POA: Diagnosis present

## 2021-11-12 DIAGNOSIS — Y9356 Activity, jumping rope: Secondary | ICD-10-CM | POA: Insufficient documentation

## 2021-11-13 ENCOUNTER — Other Ambulatory Visit (HOSPITAL_COMMUNITY): Payer: Self-pay | Admitting: Orthopedic Surgery

## 2021-11-13 ENCOUNTER — Encounter (HOSPITAL_BASED_OUTPATIENT_CLINIC_OR_DEPARTMENT_OTHER): Payer: Self-pay | Admitting: Orthopedic Surgery

## 2021-11-13 ENCOUNTER — Other Ambulatory Visit: Payer: Self-pay

## 2021-11-16 ENCOUNTER — Encounter (HOSPITAL_BASED_OUTPATIENT_CLINIC_OR_DEPARTMENT_OTHER)
Admission: RE | Admit: 2021-11-16 | Discharge: 2021-11-16 | Disposition: A | Discharge status: Home / Self Care | Payer: 59 | Source: Ambulatory Visit | Attending: Orthopedic Surgery | Admitting: Orthopedic Surgery

## 2021-11-16 DIAGNOSIS — S86011A Strain of right Achilles tendon, initial encounter: Secondary | ICD-10-CM | POA: Diagnosis not present

## 2021-11-16 DIAGNOSIS — G4730 Sleep apnea, unspecified: Secondary | ICD-10-CM | POA: Diagnosis not present

## 2021-11-16 DIAGNOSIS — S92001A Unspecified fracture of right calcaneus, initial encounter for closed fracture: Secondary | ICD-10-CM | POA: Diagnosis not present

## 2021-11-16 DIAGNOSIS — X58XXXA Exposure to other specified factors, initial encounter: Secondary | ICD-10-CM | POA: Diagnosis not present

## 2021-11-16 NOTE — Progress Notes (Signed)

## 2021-11-17 ENCOUNTER — Ambulatory Visit (HOSPITAL_BASED_OUTPATIENT_CLINIC_OR_DEPARTMENT_OTHER): Payer: Worker's Compensation | Admitting: Certified Registered"

## 2021-11-17 ENCOUNTER — Encounter (HOSPITAL_BASED_OUTPATIENT_CLINIC_OR_DEPARTMENT_OTHER): Payer: Self-pay | Admitting: Orthopedic Surgery

## 2021-11-17 ENCOUNTER — Other Ambulatory Visit: Payer: Self-pay

## 2021-11-17 ENCOUNTER — Ambulatory Visit (HOSPITAL_BASED_OUTPATIENT_CLINIC_OR_DEPARTMENT_OTHER)
Admission: RE | Admit: 2021-11-17 | Discharge: 2021-11-17 | Disposition: A | Discharge status: Home / Self Care | Payer: Worker's Compensation | Attending: Orthopedic Surgery | Admitting: Orthopedic Surgery

## 2021-11-17 ENCOUNTER — Ambulatory Visit (HOSPITAL_BASED_OUTPATIENT_CLINIC_OR_DEPARTMENT_OTHER): Payer: Worker's Compensation

## 2021-11-17 ENCOUNTER — Encounter (HOSPITAL_BASED_OUTPATIENT_CLINIC_OR_DEPARTMENT_OTHER)
Admission: RE | Disposition: A | Discharge status: Home / Self Care | Payer: Self-pay | Source: Home / Self Care | Attending: Orthopedic Surgery

## 2021-11-17 DIAGNOSIS — Z01818 Encounter for other preprocedural examination: Secondary | ICD-10-CM

## 2021-11-17 DIAGNOSIS — F418 Other specified anxiety disorders: Secondary | ICD-10-CM | POA: Diagnosis not present

## 2021-11-17 DIAGNOSIS — X58XXXA Exposure to other specified factors, initial encounter: Secondary | ICD-10-CM | POA: Insufficient documentation

## 2021-11-17 DIAGNOSIS — S86011A Strain of right Achilles tendon, initial encounter: Secondary | ICD-10-CM

## 2021-11-17 DIAGNOSIS — M6701 Short Achilles tendon (acquired), right ankle: Secondary | ICD-10-CM

## 2021-11-17 DIAGNOSIS — S92001A Unspecified fracture of right calcaneus, initial encounter for closed fracture: Secondary | ICD-10-CM

## 2021-11-17 DIAGNOSIS — G4730 Sleep apnea, unspecified: Secondary | ICD-10-CM | POA: Insufficient documentation

## 2021-11-17 HISTORY — DX: Strain of right Achilles tendon, initial encounter: S86.011A

## 2021-11-17 HISTORY — PX: ORIF CALCANEOUS FRACTURE: SHX5030

## 2021-11-17 HISTORY — DX: Left bundle-branch block, unspecified: I44.7

## 2021-11-17 HISTORY — PX: ACHILLES TENDON SURGERY: SHX542

## 2021-11-17 HISTORY — PX: GASTROCNEMIUS RECESSION: SHX863

## 2021-11-17 HISTORY — DX: Anxiety disorder, unspecified: F41.9

## 2021-11-17 SURGERY — OPEN REDUCTION INTERNAL FIXATION (ORIF) CALCANEOUS FRACTURE
Anesthesia: General | Site: Leg Lower | Laterality: Right

## 2021-11-17 MED ORDER — MEPERIDINE HCL 25 MG/ML IJ SOLN: 6.2500 mg | INTRAMUSCULAR | Status: DC | PRN

## 2021-11-17 MED ORDER — OXYCODONE HCL 5 MG PO TABS
ORAL_TABLET | ORAL | Status: AC
  Filled 2021-11-17: qty 1

## 2021-11-17 MED ORDER — LIDOCAINE HCL (CARDIAC) PF 100 MG/5ML IV SOSY
PREFILLED_SYRINGE | INTRAVENOUS | Status: DC | PRN
  Administered 2021-11-17: 40 mg via INTRAVENOUS

## 2021-11-17 MED ORDER — AMISULPRIDE (ANTIEMETIC) 5 MG/2ML IV SOLN
10.0000 mg | Freq: Once | INTRAVENOUS | Status: AC
  Administered 2021-11-17: 10 mg via INTRAVENOUS

## 2021-11-17 MED ORDER — ROCURONIUM BROMIDE 10 MG/ML (PF) SYRINGE
PREFILLED_SYRINGE | INTRAVENOUS | Status: AC
  Filled 2021-11-17: qty 10

## 2021-11-17 MED ORDER — CEFAZOLIN SODIUM-DEXTROSE 2-4 GM/100ML-% IV SOLN
INTRAVENOUS | Status: AC
  Filled 2021-11-17: qty 100

## 2021-11-17 MED ORDER — FENTANYL CITRATE (PF) 100 MCG/2ML IJ SOLN
INTRAMUSCULAR | Status: AC
  Filled 2021-11-17: qty 2

## 2021-11-17 MED ORDER — AMISULPRIDE (ANTIEMETIC) 5 MG/2ML IV SOLN
INTRAVENOUS | Status: AC
  Filled 2021-11-17: qty 4

## 2021-11-17 MED ORDER — RIVAROXABAN 10 MG PO TABS: 10.0000 mg | ORAL_TABLET | Freq: Every day | ORAL | 0 refills | Status: DC

## 2021-11-17 MED ORDER — SUGAMMADEX SODIUM 500 MG/5ML IV SOLN
INTRAVENOUS | Status: DC | PRN
  Administered 2021-11-17: 300 mg via INTRAVENOUS

## 2021-11-17 MED ORDER — PROPOFOL 10 MG/ML IV BOLUS
INTRAVENOUS | Status: AC
  Filled 2021-11-17: qty 20

## 2021-11-17 MED ORDER — EPHEDRINE SULFATE (PRESSORS) 50 MG/ML IJ SOLN
INTRAMUSCULAR | Status: DC | PRN
  Administered 2021-11-17: 10 mg via INTRAVENOUS
  Administered 2021-11-17: 5 mg via INTRAVENOUS
  Administered 2021-11-17: 10 mg via INTRAVENOUS

## 2021-11-17 MED ORDER — ACETAMINOPHEN 500 MG PO TABS
1000.0000 mg | ORAL_TABLET | Freq: Once | ORAL | Status: AC
  Administered 2021-11-17: 1000 mg via ORAL

## 2021-11-17 MED ORDER — VANCOMYCIN HCL 500 MG IV SOLR
INTRAVENOUS | Status: DC | PRN
Start: 2021-11-17 — End: 2021-11-17
  Administered 2021-11-17: 500 mg

## 2021-11-17 MED ORDER — HYDROMORPHONE HCL 1 MG/ML IJ SOLN: 0.2500 mg | INTRAMUSCULAR | Status: DC | PRN

## 2021-11-17 MED ORDER — VANCOMYCIN HCL 500 MG IV SOLR
INTRAVENOUS | Status: AC
  Filled 2021-11-17: qty 10

## 2021-11-17 MED ORDER — LACTATED RINGERS IV SOLN: INTRAVENOUS | Status: DC

## 2021-11-17 MED ORDER — SODIUM CHLORIDE 0.9 % IV SOLN: INTRAVENOUS | Status: DC

## 2021-11-17 MED ORDER — LIDOCAINE 2% (20 MG/ML) 5 ML SYRINGE
INTRAMUSCULAR | Status: AC
  Filled 2021-11-17: qty 5

## 2021-11-17 MED ORDER — ACETAMINOPHEN 500 MG PO TABS
ORAL_TABLET | ORAL | Status: AC
  Filled 2021-11-17: qty 2

## 2021-11-17 MED ORDER — MIDAZOLAM HCL 2 MG/2ML IJ SOLN
INTRAMUSCULAR | Status: AC
  Filled 2021-11-17: qty 2

## 2021-11-17 MED ORDER — DEXMEDETOMIDINE (PRECEDEX) IN NS 20 MCG/5ML (4 MCG/ML) IV SYRINGE
PREFILLED_SYRINGE | INTRAVENOUS | Status: DC | PRN
  Administered 2021-11-17: 8 ug via INTRAVENOUS

## 2021-11-17 MED ORDER — DEXAMETHASONE SODIUM PHOSPHATE 4 MG/ML IJ SOLN
INTRAMUSCULAR | Status: DC | PRN
  Administered 2021-11-17: 5 mg via INTRAVENOUS

## 2021-11-17 MED ORDER — DOCUSATE SODIUM 100 MG PO CAPS: 100.0000 mg | ORAL_CAPSULE | Freq: Two times a day (BID) | ORAL | 0 refills | Status: DC

## 2021-11-17 MED ORDER — FENTANYL CITRATE (PF) 100 MCG/2ML IJ SOLN
INTRAMUSCULAR | Status: DC | PRN
  Administered 2021-11-17: 100 ug via INTRAVENOUS

## 2021-11-17 MED ORDER — ONDANSETRON HCL 4 MG/2ML IJ SOLN
INTRAMUSCULAR | Status: DC | PRN
  Administered 2021-11-17: 4 mg via INTRAVENOUS

## 2021-11-17 MED ORDER — 0.9 % SODIUM CHLORIDE (POUR BTL) OPTIME
TOPICAL | Status: DC | PRN
  Administered 2021-11-17: 120 mL

## 2021-11-17 MED ORDER — FENTANYL CITRATE (PF) 100 MCG/2ML IJ SOLN
100.0000 ug | Freq: Once | INTRAMUSCULAR | Status: AC
  Administered 2021-11-17: 100 ug via INTRAVENOUS

## 2021-11-17 MED ORDER — OXYCODONE HCL 5 MG PO TABS: 5.0000 mg | ORAL_TABLET | ORAL | 0 refills | Status: AC | PRN

## 2021-11-17 MED ORDER — BUPIVACAINE-EPINEPHRINE (PF) 0.5% -1:200000 IJ SOLN
INTRAMUSCULAR | Status: DC | PRN
  Administered 2021-11-17: 30 mL via PERINEURAL

## 2021-11-17 MED ORDER — PROPOFOL 10 MG/ML IV BOLUS
INTRAVENOUS | Status: DC | PRN
  Administered 2021-11-17: 200 mg via INTRAVENOUS

## 2021-11-17 MED ORDER — ROCURONIUM BROMIDE 100 MG/10ML IV SOLN
INTRAVENOUS | Status: DC | PRN
  Administered 2021-11-17: 70 mg via INTRAVENOUS

## 2021-11-17 MED ORDER — OXYCODONE HCL 5 MG/5ML PO SOLN: 5.0000 mg | Freq: Once | ORAL | Status: AC | PRN

## 2021-11-17 MED ORDER — SENNA 8.6 MG PO TABS
2.0000 | ORAL_TABLET | Freq: Two times a day (BID) | ORAL | 0 refills | Status: DC
Start: 2021-11-17 — End: 2022-06-01

## 2021-11-17 MED ORDER — CEFAZOLIN SODIUM-DEXTROSE 2-4 GM/100ML-% IV SOLN
2.0000 g | INTRAVENOUS | Status: AC
  Administered 2021-11-17: 2 g via INTRAVENOUS

## 2021-11-17 MED ORDER — MIDAZOLAM HCL 2 MG/2ML IJ SOLN
2.0000 mg | Freq: Once | INTRAMUSCULAR | Status: AC
  Administered 2021-11-17: 2 mg via INTRAVENOUS

## 2021-11-17 MED ORDER — MIDAZOLAM HCL 2 MG/2ML IJ SOLN: 0.5000 mg | Freq: Once | INTRAMUSCULAR | Status: DC | PRN

## 2021-11-17 MED ORDER — OXYCODONE HCL 5 MG PO TABS
5.0000 mg | ORAL_TABLET | Freq: Once | ORAL | Status: AC | PRN
  Administered 2021-11-17: 5 mg via ORAL

## 2021-11-17 SURGICAL SUPPLY — 86 items
ANCH SUT 2 2.9 2 LD TPR NDL (Anchor) ×1 IMPLANT
ANCHOR JUGGERKNOT WTAP NDL 2.9 (Anchor) ×1 IMPLANT
APL PRP STRL LF DISP 70% ISPRP (MISCELLANEOUS) ×1
BANDAGE ESMARK 6X9 LF (GAUZE/BANDAGES/DRESSINGS) ×1 IMPLANT
BIT DRILL JUGRKNT W/NDL BIT2.9 (DRILL) IMPLANT
BLADE AVERAGE 25X9 (BLADE) IMPLANT
BLADE MICRO SAGITTAL (BLADE) ×1 IMPLANT
BLADE SURG 15 STRL LF DISP TIS (BLADE) ×2 IMPLANT
BLADE SURG 15 STRL SS (BLADE) ×4
BNDG CMPR 5X6 CHSV STRCH STRL (GAUZE/BANDAGES/DRESSINGS)
BNDG CMPR 9X6 STRL LF SNTH (GAUZE/BANDAGES/DRESSINGS) ×1
BNDG CMPR STD VLCR NS LF 5.8X4 (GAUZE/BANDAGES/DRESSINGS) ×1
BNDG COHESIVE 4X5 TAN ST LF (GAUZE/BANDAGES/DRESSINGS) IMPLANT
BNDG COHESIVE 6X5 TAN ST LF (GAUZE/BANDAGES/DRESSINGS) IMPLANT
BNDG ELASTIC 4X5.8 VLCR NS LF (GAUZE/BANDAGES/DRESSINGS) ×2 IMPLANT
BNDG ELASTIC 4X5.8 VLCR STR LF (GAUZE/BANDAGES/DRESSINGS) IMPLANT
BNDG ELASTIC 6X5.8 VLCR STR LF (GAUZE/BANDAGES/DRESSINGS) ×2 IMPLANT
BNDG ESMARK 6X9 LF (GAUZE/BANDAGES/DRESSINGS) ×2
BOOT STEPPER DURA LG (SOFTGOODS) IMPLANT
BOOT STEPPER DURA MED (SOFTGOODS) IMPLANT
BOOT STEPPER DURA XLG (SOFTGOODS) IMPLANT
CANISTER SUCT 1200ML W/VALVE (MISCELLANEOUS) ×2 IMPLANT
CHLORAPREP W/TINT 26 (MISCELLANEOUS) ×2 IMPLANT
COVER BACK TABLE 60X90IN (DRAPES) ×2 IMPLANT
CUFF TOURN SGL QUICK 34 (TOURNIQUET CUFF)
CUFF TRNQT CYL 34X4.125X (TOURNIQUET CUFF) IMPLANT
DRAPE C-ARM 35X43 STRL (DRAPES) ×1 IMPLANT
DRAPE EXTREMITY T 121X128X90 (DISPOSABLE) ×2 IMPLANT
DRAPE OEC MINIVIEW 54X84 (DRAPES) ×2 IMPLANT
DRAPE U-SHAPE 47X51 STRL (DRAPES) ×2 IMPLANT
DRILL JUGGERKNOT W/NDL BIT 2.9 (DRILL) ×2
DRSG MEPITEL 4X7.2 (GAUZE/BANDAGES/DRESSINGS) ×2 IMPLANT
DRSG PAD ABDOMINAL 8X10 ST (GAUZE/BANDAGES/DRESSINGS) ×4 IMPLANT
ELECT REM PT RETURN 9FT ADLT (ELECTROSURGICAL) ×2
ELECTRODE REM PT RTRN 9FT ADLT (ELECTROSURGICAL) ×1 IMPLANT
GAUZE SPONGE 4X4 12PLY STRL (GAUZE/BANDAGES/DRESSINGS) ×2 IMPLANT
GLOVE BIO SURGEON STRL SZ8 (GLOVE) ×2 IMPLANT
GLOVE BIOGEL PI IND STRL 8 (GLOVE) ×2 IMPLANT
GLOVE BIOGEL PI INDICATOR 8 (GLOVE) ×2
GLOVE ECLIPSE 8.0 STRL XLNG CF (GLOVE) ×2 IMPLANT
GOWN STRL REUS W/ TWL LRG LVL3 (GOWN DISPOSABLE) ×1 IMPLANT
GOWN STRL REUS W/ TWL XL LVL3 (GOWN DISPOSABLE) ×2 IMPLANT
GOWN STRL REUS W/TWL LRG LVL3 (GOWN DISPOSABLE) ×2
GOWN STRL REUS W/TWL XL LVL3 (GOWN DISPOSABLE) ×4
NDL HYPO 25X1 1.5 SAFETY (NEEDLE) IMPLANT
NDL SUT 6 .5 CRC .975X.05 MAYO (NEEDLE) IMPLANT
NEEDLE HYPO 22GX1.5 SAFETY (NEEDLE) IMPLANT
NEEDLE HYPO 25X1 1.5 SAFETY (NEEDLE) IMPLANT
NEEDLE MAYO TAPER (NEEDLE)
NS IRRIG 1000ML POUR BTL (IV SOLUTION) ×2 IMPLANT
PACK BASIN DAY SURGERY FS (CUSTOM PROCEDURE TRAY) ×2 IMPLANT
PAD CAST 4YDX4 CTTN HI CHSV (CAST SUPPLIES) ×1 IMPLANT
PADDING CAST ABS 4INX4YD NS (CAST SUPPLIES) ×1
PADDING CAST ABS COTTON 4X4 ST (CAST SUPPLIES) ×1 IMPLANT
PADDING CAST COTTON 4X4 STRL (CAST SUPPLIES) ×2
PADDING CAST COTTON 6X4 STRL (CAST SUPPLIES) ×2 IMPLANT
PENCIL SMOKE EVACUATOR (MISCELLANEOUS) ×2 IMPLANT
SANITIZER HAND PURELL 535ML FO (MISCELLANEOUS) ×2 IMPLANT
SHEET MEDIUM DRAPE 40X70 STRL (DRAPES) ×2 IMPLANT
SLEEVE SCD COMPRESS KNEE MED (STOCKING) ×2 IMPLANT
SPIKE FLUID TRANSFER (MISCELLANEOUS) IMPLANT
SPLINT FAST PLASTER 5X30 (CAST SUPPLIES) ×20
SPLINT PLASTER CAST FAST 5X30 (CAST SUPPLIES) ×20 IMPLANT
SPONGE T-LAP 18X18 ~~LOC~~+RFID (SPONGE) ×2 IMPLANT
STOCKINETTE 6  STRL (DRAPES) ×2
STOCKINETTE 6 STRL (DRAPES) ×1 IMPLANT
SUCTION FRAZIER HANDLE 10FR (MISCELLANEOUS) ×2
SUCTION TUBE FRAZIER 10FR DISP (MISCELLANEOUS) ×1 IMPLANT
SUT ETHILON 3 0 PS 1 (SUTURE) ×6 IMPLANT
SUT FIBERWIRE #2 38 T-5 BLUE (SUTURE)
SUT MNCRL AB 3-0 PS2 18 (SUTURE) ×2 IMPLANT
SUT VIC AB 1 CT1 27 (SUTURE) ×4
SUT VIC AB 1 CT1 27XBRD ANBCTR (SUTURE) IMPLANT
SUT VIC AB 2-0 SH 18 (SUTURE) IMPLANT
SUT VIC AB 2-0 SH 27 (SUTURE) ×4
SUT VIC AB 2-0 SH 27XBRD (SUTURE) IMPLANT
SUT VICRYL 0 SH 27 (SUTURE) ×3 IMPLANT
SUTURE FIBERWR #2 38 T-5 BLUE (SUTURE) IMPLANT
SUTURE TAPE 1.3 FIBERLOP 20 ST (SUTURE) IMPLANT
SUTURETAPE 1.3 FIBERLOOP 20 ST (SUTURE)
SYR BULB EAR ULCER 3OZ GRN STR (SYRINGE) ×2 IMPLANT
SYR CONTROL 10ML LL (SYRINGE) IMPLANT
TOWEL GREEN STERILE FF (TOWEL DISPOSABLE) ×4 IMPLANT
TUBE CONNECTING 20X1/4 (TUBING) ×2 IMPLANT
UNDERPAD 30X36 HEAVY ABSORB (UNDERPADS AND DIAPERS) ×2 IMPLANT
YANKAUER SUCT BULB TIP NO VENT (SUCTIONS) IMPLANT

## 2021-11-17 NOTE — Op Note (Signed)
11/17/2021  2:16 PM  PATIENT:  Deanna Simpson  56 y.o. female  PRE-OPERATIVE DIAGNOSIS: 1.  Right achilles tendon rupture      2.  Right calcaneus avulsion fracture      3.  Short right achilles tendon  POST-OPERATIVE DIAGNOSIS:  same  Procedure(s): 1.  Right gastrocnemius recession   2.  Right achilles tendon debridement and reconstruction  SURGEON:  Toni Arthurs, MD  ASSISTANT: Alfredo Martinez, PA-C  ANESTHESIA:   General, regional  EBL:  minimal   TOURNIQUET:   Total Tourniquet Time Documented: Thigh (Right) - 60 minutes Total: Thigh (Right) - 60 minutes  COMPLICATIONS:  None apparent  DISPOSITION:  Extubated, awake and stable to recovery.  INDICATION FOR PROCEDURE: The patient is a 56 year old female without significant past medical history.  She has history of right heel pain with insertional Achilles tendinopathy.  She fell several weeks ago injuring her right heel.  Radiographs reveal an avulsion fracture of the calcaneus at the insertion of the Achilles.  Her physical exam is consistent with an Achilles tendon rupture.  She presents now for open treatment of this acute on chronic Achilles tendon injury.  The risks and benefits of the alternative treatment options have been discussed in detail.  The patient wishes to proceed with surgery and specifically understands risks of bleeding, infection, nerve damage, blood clots, need for additional surgery, amputation and death.   PROCEDURE IN DETAIL: After preoperative consent was obtained and the correct operative site was identified, the patient was brought to the operating room supine on stretcher.  General anesthesia was induced.  Preoperative antibiotics were administered.  Surgical timeout was taken.  The right lower extremity was exsanguinated and a thigh tourniquet inflated to 250 mmHg.  The patient was then turned to the prone position on the operating table with all bony prominences padded well.  The right lower extremity was  prepped and draped in standard sterile fashion.  A longitudinal incision was made over the insertion of the Achilles.  Dissection was carried sharply down through the subcutaneous tissues.  The Achilles rupture site was identified.  The superficial one third of the tendon was attached to a fragment of bone from the calcaneus which was displaced proximally.  There was a coronal plane tear in the tendon proximally to the proximal musculotendinous junction with the deep two thirds of the tendon still attached to the calcaneus.  Attention was turned to the calf where a longitudinal incision was made.  Dissection was carried down through the subcutaneous tissues taking care to protect the sural nerve and lesser saphenous vein.  Superficial fascia was incised.  The gastrocnemius tendon was identified.  It was divided under direct vision to allow enough length distally for adequate repair.  The tendon was then mobilized within the tendon sheath.  A traction suture was placed at the superficial layer of Achilles distally.  The degenerated portion of the tendon was debrided distally.  A #1 Vicryl suture was then advanced through the proximal portion of the tendon and into the distal stump.  A Bunnell suture was then advanced down the tendon back up passing the suture back through the superficial layer of the tendon.  The second suture was placed in the same fashion at the lateral half of the tendon leaving 4 strands of suture protruding through the superficial layer.  The distal calcaneus fracture site was debrided with a rondure.  A juggernaut anchor from [employer] was inserted into the calcaneus at the  insertion point of the superficial portion of the tendon.  The anchor was then passed through the superficial layer again using a Bennell suture both medially and laterally.  The ankle was plantarflexed and the proximal 2 sutures tied securely.  The distal 2 sutures were also tied securely.  This repaired the  proximal rupture site and reconstructed the distal rupture site securely.  Figure-of-eight sutures of 0 Vicryl were used to approximate the coronal plane tear in the tendon at the midpoint.  The ankle could be dorsiflexed to neutral without evident gapping at the tendon repair site.  The wound was then irrigated copiously and sprinkled with vancomycin powder.  The peritenon was repaired over the Achilles with inverted simple sutures of 2-0 Vicryl.  The skin incision was closed with horizontal mattress sutures of 3-0 nylon.  The proximal incision was closed with Monocryl and nylon.  Sterile dressings were applied followed by well-padded short leg splint with the ankle in gravity equinus.  The tourniquet was released after application of the dressings.  The patient was awakened from anesthesia and transported to the recovery room in stable condition.  FOLLOW UP PLAN: Nonweightbearing on the right lower extremity.  Xarelto for DVT prophylaxis.  Follow-up in the office in 2 weeks for suture removal and conversion to a short leg cast.  Plan 6 weeks postoperative nonweightbearing immobilization.   Alfredo Martinez PA-C was present and scrubbed for the duration of the operative case. His assistance was essential in positioning the patient, prepping and draping, gaining and maintaining exposure, performing the operation, closing and dressing the wounds and applying the splint.

## 2021-11-17 NOTE — Anesthesia Postprocedure Evaluation (Signed)
Anesthesia Post Note  Patient: Kloi Brodman Rease  Procedure(s) Performed: right open treatment of calcaneus fracture (Right: Leg Lower) RIGHT ACHILLES TENDON RECONSTRUCTION (Right: Leg Lower) RIGHT GASTROCNEMIUS RECESSION (Right: Leg Lower)     Patient location during evaluation: PACU Anesthesia Type: General Level of consciousness: awake and alert Pain management: pain level controlled Vital Signs Assessment: post-procedure vital signs reviewed and stable Respiratory status: spontaneous breathing, nonlabored ventilation and respiratory function stable Cardiovascular status: blood pressure returned to baseline and stable Postop Assessment: no apparent nausea or vomiting Anesthetic complications: no   No notable events documented.  Last Vitals:  Vitals:   11/17/21 1500 11/17/21 1507  BP: 139/79   Pulse: 78 78  Resp: 18 19  Temp:    SpO2: 95% 95%    Last Pain:  Vitals:   11/17/21 1423  TempSrc:   PainSc: Asleep                 Lowella Curb

## 2021-11-17 NOTE — Progress Notes (Signed)
Assisted Dr. Jairo Ben with right, popliteal, ultrasound guided block. Side rails up, monitors on throughout procedure. See vital signs in flow sheet. Tolerated Procedure well.

## 2021-11-17 NOTE — Discharge Instructions (Addendum)
Toni Arthurs, MD [facility]  Please read the following information regarding your care after surgery.  Medications  You only need a prescription for the narcotic pain medicine (ex. oxycodone, Percocet, Norco).  All of the other medicines listed below are available over the counter. ? Aleve 2 pills twice a day for the first 3 days after surgery. ? acetominophen (Tylenol) 650 mg every 4-6 hours as you need for minor to moderate pain ? oxycodone as prescribed for severe pain  Narcotic pain medicine (ex. oxycodone, Percocet, Vicodin) will cause constipation.  To prevent this problem, take the following medicines while you are taking any pain medicine. ? docusate sodium (Colace) 100 mg twice a day ? senna (Senokot) 2 tablets twice a day  ? To help prevent blood clots, take Xarelto as prescribed for two weeks after surgery.  You should also get up every hour while you are awake to move around.    Weight Bearing ? Do not bear any weight on the operated leg or foot.  Cast / Splint / Dressing ? Keep your splint, cast or dressing clean and dry.  Don't put anything (coat hanger, pencil, etc) down inside of it.  If it gets damp, use a hair dryer on the cool setting to dry it.  If it gets soaked, call the office to schedule an appointment for a cast change.   After your dressing, cast or splint is removed; you may shower, but do not soak or scrub the wound.  Allow the water to run over it, and then gently pat it dry.  Swelling It is normal for you to have swelling where you had surgery.  To reduce swelling and pain, keep your toes above your nose for at least 3 days after surgery.  It may be necessary to keep your foot or leg elevated for several weeks.  If it hurts, it should be elevated.  Follow Up Call my office at [phone] when you are discharged from the hospital or surgery center to schedule an appointment to be seen two weeks after surgery.  Call my office at [phone] if you develop  a fever >101.5 F, nausea, vomiting, bleeding from the surgical site or severe pain.        Post Anesthesia Home Care Instructions  Activity: Get plenty of rest for the remainder of the day. A responsible individual must stay with you for 24 hours following the procedure.  For the next 24 hours, DO NOT: -Drive a car -Advertising copywriter -Drink alcoholic beverages -Take any medication unless instructed by your physician -Make any legal decisions or sign important papers.  Meals: Start with liquid foods such as gelatin or soup. Progress to regular foods as tolerated. Avoid greasy, spicy, heavy foods. If nausea and/or vomiting occur, drink only clear liquids until the nausea and/or vomiting subsides. Call your physician if vomiting continues.  Special Instructions/Symptoms: Your throat may feel dry or sore from the anesthesia or the breathing tube placed in your throat during surgery. If this causes discomfort, gargle with warm salt water. The discomfort should disappear within 24 hours.  If you had a scopolamine patch placed behind your ear for the management of post- operative nausea and/or vomiting:  1. The medication in the patch is effective for 72 hours, after which it should be removed.  Wrap patch in a tissue and discard in the trash. Wash hands thoroughly with soap and water. 2. You may remove the patch earlier than 72 hours if you experience unpleasant side  effects which may include dry mouth, dizziness or visual disturbances. 3. Avoid touching the patch. Wash your hands with soap and water after contact with the patch.        Call your surgeon if you experience:   1.  Fever over 101.0. 2.  Inability to urinate. 3.  Nausea and/or vomiting. 4.  Extreme swelling or bruising at the surgical site. 5.  Continued bleeding from the incision. 6.  Increased pain, redness or drainage from the incision. 7.  Problems related to your pain medication. 8.  Any problems and/or concerns

## 2021-11-17 NOTE — Anesthesia Procedure Notes (Signed)
Procedure Name: Intubation Date/Time: 11/17/2021 12:55 PM Performed by: Maryella Shivers, CRNA Pre-anesthesia Checklist: Patient identified, Emergency Drugs available, Suction available and Patient being monitored Patient Re-evaluated:Patient Re-evaluated prior to induction Oxygen Delivery Method: Circle system utilized Preoxygenation: Pre-oxygenation with 100% oxygen Induction Type: IV induction Ventilation: Mask ventilation without difficulty Laryngoscope Size: Mac and 3 Grade View: Grade I Tube type: Oral Tube size: 7.0 mm Number of attempts: 1 Airway Equipment and Method: Stylet and Oral airway Placement Confirmation: ETT inserted through vocal cords under direct vision, positive ETCO2 and breath sounds checked- equal and bilateral Secured at: 21 cm Tube secured with: Tape Dental Injury: Teeth and Oropharynx as per pre-operative assessment

## 2021-11-17 NOTE — Anesthesia Preprocedure Evaluation (Addendum)
Anesthesia Evaluation  Patient identified by MRN, date of birth, ID band Patient awake    Reviewed: Allergy & Precautions, NPO status , Patient's Chart, lab work & pertinent test results  History of Anesthesia Complications (+) Emergence Delirium  Airway Mallampati: II  TM Distance: >3 FB Neck ROM: Full    Dental  (+) Dental Advisory Given, Teeth Intact   Pulmonary sleep apnea and Continuous Positive Airway Pressure Ventilation ,    breath sounds clear to auscultation       Cardiovascular (-) angina Rhythm:Regular Rate:Normal  H/o LBBB: work up in [place], including Stress and ECHO below   10/2020 stress: . 1. Normal Lexiscan SPECT study. There was no reversible ischemia.   There was a fixed apical to mid anterior perfusion defect with mild photon reduction likely related to breast attenuation artifact.  . 2. Normal left ventricular systolic function. Calculated EF stress was 69% and at rest was 62%. There is no wall motion abnormalities.  . 3. There were no electrocardiographic changes after Lexiscan.   07/2020 ECHO: . Normal left ventricular size and function. EF 60-65%.  . Grade I (mild/impaired relaxation) left ventricular diastolic dysfunction.  . Normal right ventricular size and function.  . No significant valvular disease.  . Negative bubble study; no PFO seen.      Neuro/Psych Anxiety Depression negative neurological ROS     GI/Hepatic negative GI ROS, Neg liver ROS,   Endo/Other  Morbid obesity  Renal/GU negative Renal ROS     Musculoskeletal   Abdominal (+) + obese,   Peds  Hematology negative hematology ROS (+)   Anesthesia Other Findings   Reproductive/Obstetrics                            Anesthesia Physical Anesthesia Plan  ASA: 3  Anesthesia Plan: General   Post-op Pain Management: Regional block* and Tylenol PO (pre-op)*   Induction: Intravenous  PONV  Risk Score and Plan: 3 and Ondansetron, Dexamethasone and Treatment may vary due to age or medical condition  Airway Management Planned: Oral ETT  Additional Equipment: None  Intra-op Plan:   Post-operative Plan: Extubation in OR  Informed Consent: I have reviewed the patients History and Physical, chart, labs and discussed the procedure including the risks, benefits and alternatives for the proposed anesthesia with the patient or authorized representative who has indicated his/her understanding and acceptance.     Dental advisory given  Plan Discussed with: CRNA and Surgeon  Anesthesia Plan Comments: (Plan routine monitors, GA with popliteal block for post op analgesia)       Anesthesia Quick Evaluation

## 2021-11-17 NOTE — Transfer of Care (Signed)
Immediate Anesthesia Transfer of Care Note  Patient: Deanna Simpson  Procedure(s) Performed: right open treatment of calcaneus fracture (Right: Leg Lower) RIGHT ACHILLES TENDON RECONSTRUCTION (Right: Leg Lower) RIGHT GASTROCNEMIUS RECESSION (Right: Leg Lower)  Patient Location: PACU  Anesthesia Type:GA combined with regional for post-op pain  Level of Consciousness: sedated  Airway & Oxygen Therapy: Patient Spontanous Breathing and Patient connected to face mask oxygen  Post-op Assessment: Report given to RN and Post -op Vital signs reviewed and stable  Post vital signs: Reviewed and stable  Last Vitals:  Vitals Value Taken Time  BP 160/80 11/17/21 1421  Temp    Pulse 74 11/17/21 1424  Resp 16 11/17/21 1424  SpO2 99 % 11/17/21 1424  Vitals shown include unvalidated device data.  Last Pain:  Vitals:   11/17/21 1052  TempSrc: Oral  PainSc: 0-No pain      Patients Stated Pain Goal: 3 ([id number])  Complications: No notable events documented.

## 2021-11-17 NOTE — Anesthesia Procedure Notes (Signed)
Anesthesia Regional Block: Popliteal block   Pre-Anesthetic Checklist: , timeout performed,  Correct Patient, Correct Site, Correct Laterality,  Correct Procedure, Correct Position, site marked,  Risks and benefits discussed,  Surgical consent,  Pre-op evaluation,  At surgeon's request and post-op pain management  Laterality: Right and Lower  Prep: chloraprep       Needles:  Injection technique: Single-shot  Needle Type: Echogenic Needle     Needle Length: 9cm  Needle Gauge: 21     Additional Needles:   Procedures:,,,, ultrasound used (permanent image in chart),,    Narrative:  Start time: 11/17/2021 11:59 AM End time: 11/17/2021 12:05 PM Injection made incrementally with aspirations every 5 mL.  Performed by: Personally  Anesthesiologist: Jairo Ben, MD  Additional Notes: Pt identified in Holding room.  Monitors applied. Working IV access confirmed. Sterile prep R lateral knee/distal thigh.  #21ga ECHOgenic Arrow block needle to sciatic nerve at split in pop fossa with US guidance.  30cc 0.5% Bupivacaine 1:200k epi injected incrementally after negative test dose.  Patient asymptomatic, VSS, no heme aspirated, tolerated well.   Sandford Craze, MD

## 2021-11-17 NOTE — H&P (Signed)
Deanna Simpson is an 56 y.o. female.   Chief Complaint: right ankle pain HPI: 56 year old female without significant past medical history injured her right ankle a few weeks ago.  Radiographs reveal an avulsion of the Achilles tendon from the calcaneus with a fragment of displaced calcaneus retracted proximally.  She presents today for surgical treatment of this injury.  Past Medical History:  Diagnosis Date   Achilles tendon rupture, right, initial encounter    Allergy    Anxiety    Complication of anesthesia    Histerics   Depression    Hyperlipemia    Interstitial cystitis    LBBB (left bundle branch block)    workup in [place] in Care everywhere    Past Surgical History:  Procedure Laterality Date   BREAST SURGERY     reduction   TUBAL LIGATION      Family History  Problem Relation Age of Onset   Heart disease Mother        Mitral valve prolapse   Hyperlipidemia Father    Heart disease Brother    Heart disease Maternal Grandmother    Heart disease Maternal Grandfather    Social History:  reports that she has never smoked. She has never used smokeless tobacco. She reports that she does not drink alcohol and does not use drugs.  Allergies:  Allergies  Allergen Reactions   Antihistamines, Loratadine-Type     Heart races.   Influenza Vaccine Live Swelling    Vaccine causes swelling and pain at injection site   Pneumococcal Vaccines Swelling    Causes swelling and pain at injection site.    Medications Prior to Admission  Medication Sig Dispense Refill   Amphetamine ER (ADZENYS XR-ODT) 12.5 MG TBED Take by mouth.     ARIPiprazole (ABILIFY) 5 MG tablet Take 5 mg by mouth daily.     cholecalciferol (VITAMIN D) 1000 UNITS tablet Take 1,000 Units by mouth daily.     DULoxetine (CYMBALTA) 30 MG capsule Take 1 capsule (30 mg total) by mouth daily. 90 capsule 1   lamoTRIgine (LAMICTAL) 200 MG tablet TAKE 1 TABLET BY MOUTH DAILY 60 tablet 0   rOPINIRole (REQUIP) 1 MG tablet  Take 1 mg by mouth at bedtime.      No results found for this or any previous visit (from the past 48 hour(s)). DG MINI C-ARM IMAGE ONLY  Result Date: 11/17/2021 There is no interpretation for this exam.  This order is for images obtained during a surgical procedure.  Please See "Surgeries" Tab for more information regarding the procedure.    Review of Systems no recent fever, chills, nausea, vomiting or changes in her appetite  Blood pressure 140/90, pulse 67, temperature (!) 97.5 F (36.4 C), temperature source Oral, resp. rate 13, height 5' 3.5" (1.613 m), weight 106.3 kg, last menstrual period 07/29/2013, SpO2 95 %. Physical Exam  Well-nourished well-developed woman in no apparent distress.  Alert and oriented x4.  Normal mood and affect.  Gait is nonweightbearing on the right.  The right ankle has healthy skin and only slight swelling.  2 out of 5 strength in plantarflexion at the ankle.  Intact sensibility to light touch in the sural nerve distribution.  Palpable defect at the Achilles just proximal from the insertion on the calcaneus.  Assessment/Plan Right Achilles tendon avulsion with calcaneus fracture -to the operating room today for gastrocnemius recession, open repair of the calcaneus fracture and reconstruction of the Achilles tendon.  The risks and benefits  of the alternative treatment options have been discussed in detail.  The patient wishes to proceed with surgery and specifically understands risks of bleeding, infection, nerve damage, blood clots, need for additional surgery, amputation and death.   Wylene Simmer, MD 11/28/21, 12:44 PM

## 2021-11-19 ENCOUNTER — Encounter (HOSPITAL_BASED_OUTPATIENT_CLINIC_OR_DEPARTMENT_OTHER): Payer: Self-pay | Admitting: Orthopedic Surgery

## 2021-12-30 ENCOUNTER — Other Ambulatory Visit: Payer: Self-pay | Admitting: Neurology

## 2022-01-28 ENCOUNTER — Other Ambulatory Visit: Payer: Self-pay

## 2022-01-28 ENCOUNTER — Encounter (HOSPITAL_BASED_OUTPATIENT_CLINIC_OR_DEPARTMENT_OTHER): Payer: Self-pay | Admitting: Orthopedic Surgery

## 2022-01-29 NOTE — Progress Notes (Signed)

## 2022-02-03 ENCOUNTER — Other Ambulatory Visit (HOSPITAL_COMMUNITY): Payer: Self-pay | Admitting: Orthopedic Surgery

## 2022-02-03 NOTE — Anesthesia Preprocedure Evaluation (Signed)
Anesthesia Evaluation  Patient identified by MRN, date of birth, ID band Patient awake    Reviewed: Allergy & Precautions, NPO status , Patient's Chart, lab work & pertinent test results  History of Anesthesia Complications (+) Emergence Delirium and history of anesthetic complications  Airway Mallampati: III  TM Distance: >3 FB Neck ROM: Full    Dental no notable dental hx. (+) Dental Advisory Given   Pulmonary sleep apnea and Continuous Positive Airway Pressure Ventilation ,    Pulmonary exam normal        Cardiovascular Normal cardiovascular exam  H/o LBBB: work up in [place], including Stress and ECHO below   10/2020 stress: . 1. Normal Lexiscan SPECT study. There was no reversible ischemia.   There was a fixed apical to mid anterior perfusion defect with mild photon reduction likely related to breast attenuation artifact.  . 2. Normal left ventricular systolic function. Calculated EF stress was 69% and at rest was 62%. There is no wall motion abnormalities.  . 3. There were no electrocardiographic changes after Lexiscan.   07/2020 ECHO: . Normal left ventricular size and function. EF 60-65%.  . Grade I (mild/impaired relaxation) left ventricular diastolic dysfunction.  . Normal right ventricular size and function.  . No significant valvular disease.  . Negative bubble study; no PFO seen.      Neuro/Psych PSYCHIATRIC DISORDERS Anxiety Depression negative neurological ROS     GI/Hepatic negative GI ROS, Neg liver ROS,   Endo/Other  Morbid obesity  Renal/GU negative Renal ROS     Musculoskeletal   Abdominal (+) + obese,   Peds  Hematology negative hematology ROS (+)   Anesthesia Other Findings   Reproductive/Obstetrics                            Anesthesia Physical  Anesthesia Plan  ASA: 3  Anesthesia Plan: General   Post-op Pain Management: Regional block*, Tylenol PO  (pre-op)* and Celebrex PO (pre-op)*   Induction: Intravenous  PONV Risk Score and Plan: 3 and Ondansetron, Dexamethasone and Treatment may vary due to age or medical condition  Airway Management Planned: LMA  Additional Equipment: None  Intra-op Plan:   Post-operative Plan: Extubation in OR  Informed Consent: I have reviewed the patients History and Physical, chart, labs and discussed the procedure including the risks, benefits and alternatives for the proposed anesthesia with the patient or authorized representative who has indicated his/her understanding and acceptance.     Dental advisory given  Plan Discussed with: Anesthesiologist, CRNA and Surgeon  Anesthesia Plan Comments:        Anesthesia Quick Evaluation

## 2022-02-04 ENCOUNTER — Encounter (HOSPITAL_BASED_OUTPATIENT_CLINIC_OR_DEPARTMENT_OTHER)
Admission: RE | Disposition: A | Discharge status: Home / Self Care | Payer: Self-pay | Source: Home / Self Care | Attending: Orthopedic Surgery

## 2022-02-04 ENCOUNTER — Ambulatory Visit (HOSPITAL_BASED_OUTPATIENT_CLINIC_OR_DEPARTMENT_OTHER): Payer: Worker's Compensation | Admitting: Anesthesiology

## 2022-02-04 ENCOUNTER — Encounter (HOSPITAL_BASED_OUTPATIENT_CLINIC_OR_DEPARTMENT_OTHER): Payer: Self-pay | Admitting: Orthopedic Surgery

## 2022-02-04 ENCOUNTER — Ambulatory Visit (HOSPITAL_BASED_OUTPATIENT_CLINIC_OR_DEPARTMENT_OTHER)
Admission: RE | Admit: 2022-02-04 | Discharge: 2022-02-04 | Disposition: A | Discharge status: Home / Self Care | Payer: Worker's Compensation | Attending: Orthopedic Surgery | Admitting: Orthopedic Surgery

## 2022-02-04 ENCOUNTER — Other Ambulatory Visit: Payer: Self-pay

## 2022-02-04 DIAGNOSIS — F419 Anxiety disorder, unspecified: Secondary | ICD-10-CM | POA: Diagnosis not present

## 2022-02-04 DIAGNOSIS — Z9989 Dependence on other enabling machines and devices: Secondary | ICD-10-CM

## 2022-02-04 DIAGNOSIS — Z6841 Body Mass Index (BMI) 40.0 and over, adult: Secondary | ICD-10-CM | POA: Insufficient documentation

## 2022-02-04 DIAGNOSIS — S86011A Strain of right Achilles tendon, initial encounter: Secondary | ICD-10-CM | POA: Diagnosis present

## 2022-02-04 DIAGNOSIS — G4733 Obstructive sleep apnea (adult) (pediatric): Secondary | ICD-10-CM

## 2022-02-04 DIAGNOSIS — G4730 Sleep apnea, unspecified: Secondary | ICD-10-CM | POA: Insufficient documentation

## 2022-02-04 DIAGNOSIS — F32A Depression, unspecified: Secondary | ICD-10-CM | POA: Diagnosis not present

## 2022-02-04 DIAGNOSIS — X58XXXA Exposure to other specified factors, initial encounter: Secondary | ICD-10-CM | POA: Insufficient documentation

## 2022-02-04 DIAGNOSIS — F418 Other specified anxiety disorders: Secondary | ICD-10-CM

## 2022-02-04 HISTORY — PX: ACHILLES TENDON SURGERY: SHX542

## 2022-02-04 SURGERY — REPAIR, TENDON, ACHILLES
Anesthesia: General | Site: Foot | Laterality: Right

## 2022-02-04 MED ORDER — ROCURONIUM BROMIDE 100 MG/10ML IV SOLN
INTRAVENOUS | Status: DC | PRN
  Administered 2022-02-04: 80 mg via INTRAVENOUS

## 2022-02-04 MED ORDER — CELECOXIB 200 MG PO CAPS
ORAL_CAPSULE | ORAL | Status: AC
  Filled 2022-02-04: qty 1

## 2022-02-04 MED ORDER — OXYCODONE HCL 5 MG PO TABS: 5.0000 mg | ORAL_TABLET | ORAL | 0 refills | Status: DC | PRN

## 2022-02-04 MED ORDER — FENTANYL CITRATE (PF) 100 MCG/2ML IJ SOLN
INTRAMUSCULAR | Status: DC | PRN
  Administered 2022-02-04: 100 ug via INTRAVENOUS

## 2022-02-04 MED ORDER — FENTANYL CITRATE (PF) 100 MCG/2ML IJ SOLN
25.0000 ug | INTRAMUSCULAR | Status: DC | PRN
  Administered 2022-02-04 (×3): 25 ug via INTRAVENOUS
  Administered 2022-02-04: 50 ug via INTRAVENOUS

## 2022-02-04 MED ORDER — FENTANYL CITRATE (PF) 100 MCG/2ML IJ SOLN
INTRAMUSCULAR | Status: AC
  Filled 2022-02-04: qty 2

## 2022-02-04 MED ORDER — ONDANSETRON HCL 4 MG/2ML IJ SOLN
INTRAMUSCULAR | Status: AC
  Filled 2022-02-04: qty 2

## 2022-02-04 MED ORDER — SUGAMMADEX SODIUM 500 MG/5ML IV SOLN
INTRAVENOUS | Status: AC
  Filled 2022-02-04: qty 5

## 2022-02-04 MED ORDER — OXYCODONE HCL 5 MG PO TABS
5.0000 mg | ORAL_TABLET | Freq: Once | ORAL | Status: AC
  Administered 2022-02-04: 5 mg via ORAL

## 2022-02-04 MED ORDER — ROCURONIUM BROMIDE 10 MG/ML (PF) SYRINGE
PREFILLED_SYRINGE | INTRAVENOUS | Status: AC
  Filled 2022-02-04: qty 10

## 2022-02-04 MED ORDER — MIDAZOLAM HCL 2 MG/2ML IJ SOLN
2.0000 mg | Freq: Once | INTRAMUSCULAR | Status: AC
  Administered 2022-02-04: 2 mg via INTRAVENOUS

## 2022-02-04 MED ORDER — DEXAMETHASONE SODIUM PHOSPHATE 4 MG/ML IJ SOLN
INTRAMUSCULAR | Status: DC | PRN
  Administered 2022-02-04: 5 mg via INTRAVENOUS

## 2022-02-04 MED ORDER — PROMETHAZINE HCL 25 MG/ML IJ SOLN: 6.2500 mg | INTRAMUSCULAR | Status: DC | PRN

## 2022-02-04 MED ORDER — DEXAMETHASONE SODIUM PHOSPHATE 10 MG/ML IJ SOLN
INTRAMUSCULAR | Status: DC | PRN
  Administered 2022-02-04: 5 mg

## 2022-02-04 MED ORDER — SODIUM CHLORIDE 0.9 % IV SOLN: INTRAVENOUS | Status: DC

## 2022-02-04 MED ORDER — CEFAZOLIN SODIUM-DEXTROSE 2-4 GM/100ML-% IV SOLN
INTRAVENOUS | Status: AC
  Filled 2022-02-04: qty 100

## 2022-02-04 MED ORDER — PROPOFOL 500 MG/50ML IV EMUL
INTRAVENOUS | Status: AC
  Filled 2022-02-04: qty 50

## 2022-02-04 MED ORDER — CEFAZOLIN SODIUM-DEXTROSE 2-4 GM/100ML-% IV SOLN
2.0000 g | INTRAVENOUS | Status: AC
  Administered 2022-02-04: 2 g via INTRAVENOUS

## 2022-02-04 MED ORDER — DEXMEDETOMIDINE HCL IN NACL 80 MCG/20ML IV SOLN
INTRAVENOUS | Status: AC
  Filled 2022-02-04: qty 20

## 2022-02-04 MED ORDER — RIVAROXABAN 10 MG PO TABS: 10.0000 mg | ORAL_TABLET | Freq: Every day | ORAL | 0 refills | Status: DC

## 2022-02-04 MED ORDER — LACTATED RINGERS IV SOLN: INTRAVENOUS | Status: DC

## 2022-02-04 MED ORDER — SUGAMMADEX SODIUM 500 MG/5ML IV SOLN
INTRAVENOUS | Status: DC | PRN
  Administered 2022-02-04: 300 mg via INTRAVENOUS

## 2022-02-04 MED ORDER — AMISULPRIDE (ANTIEMETIC) 5 MG/2ML IV SOLN: 10.0000 mg | Freq: Once | INTRAVENOUS | Status: DC | PRN

## 2022-02-04 MED ORDER — PROPOFOL 10 MG/ML IV BOLUS
INTRAVENOUS | Status: DC | PRN
  Administered 2022-02-04: 180 mg via INTRAVENOUS

## 2022-02-04 MED ORDER — VANCOMYCIN HCL 500 MG IV SOLR
INTRAVENOUS | Status: AC
  Filled 2022-02-04: qty 10

## 2022-02-04 MED ORDER — VANCOMYCIN HCL 500 MG IV SOLR
INTRAVENOUS | Status: DC | PRN
  Administered 2022-02-04: 500 mg via TOPICAL

## 2022-02-04 MED ORDER — MIDAZOLAM HCL 2 MG/2ML IJ SOLN
INTRAMUSCULAR | Status: AC
  Filled 2022-02-04: qty 2

## 2022-02-04 MED ORDER — FENTANYL CITRATE (PF) 100 MCG/2ML IJ SOLN
100.0000 ug | Freq: Once | INTRAMUSCULAR | Status: AC
  Administered 2022-02-04: 100 ug via INTRAVENOUS

## 2022-02-04 MED ORDER — DEXMEDETOMIDINE (PRECEDEX) IN NS 20 MCG/5ML (4 MCG/ML) IV SYRINGE
PREFILLED_SYRINGE | INTRAVENOUS | Status: DC | PRN
  Administered 2022-02-04: 8 ug via INTRAVENOUS

## 2022-02-04 MED ORDER — PROPOFOL 10 MG/ML IV BOLUS
INTRAVENOUS | Status: AC
  Filled 2022-02-04: qty 20

## 2022-02-04 MED ORDER — CELECOXIB 200 MG PO CAPS
200.0000 mg | ORAL_CAPSULE | Freq: Once | ORAL | Status: AC
  Administered 2022-02-04: 200 mg via ORAL

## 2022-02-04 MED ORDER — ONDANSETRON HCL 4 MG/2ML IJ SOLN
INTRAMUSCULAR | Status: DC | PRN
  Administered 2022-02-04: 4 mg via INTRAVENOUS

## 2022-02-04 MED ORDER — ROPIVACAINE HCL 5 MG/ML IJ SOLN
INTRAMUSCULAR | Status: DC | PRN
  Administered 2022-02-04: 30 mL via PERINEURAL
  Administered 2022-02-04: 20 mL via PERINEURAL

## 2022-02-04 MED ORDER — OXYCODONE HCL 5 MG PO TABS
ORAL_TABLET | ORAL | Status: AC
  Filled 2022-02-04: qty 1

## 2022-02-04 MED ORDER — ACETAMINOPHEN 500 MG PO TABS
1000.0000 mg | ORAL_TABLET | Freq: Once | ORAL | Status: AC
  Administered 2022-02-04: 1000 mg via ORAL

## 2022-02-04 MED ORDER — ACETAMINOPHEN 500 MG PO TABS
ORAL_TABLET | ORAL | Status: AC
  Filled 2022-02-04: qty 2

## 2022-02-04 MED ORDER — 0.9 % SODIUM CHLORIDE (POUR BTL) OPTIME
TOPICAL | Status: DC | PRN
  Administered 2022-02-04: 300 mL

## 2022-02-04 SURGICAL SUPPLY — 85 items
APL PRP STRL LF DISP 70% ISPRP (MISCELLANEOUS) ×1
BANDAGE ESMARK 6X9 LF (GAUZE/BANDAGES/DRESSINGS) ×1 IMPLANT
BLADE AVERAGE 25X9 (BLADE) IMPLANT
BLADE MICRO SAGITTAL (BLADE) IMPLANT
BLADE SURG 15 STRL LF DISP TIS (BLADE) ×2 IMPLANT
BLADE SURG 15 STRL SS (BLADE) ×4
BNDG CMPR 9X6 STRL LF SNTH (GAUZE/BANDAGES/DRESSINGS) ×1
BNDG ELASTIC 4X5.8 VLCR STR LF (GAUZE/BANDAGES/DRESSINGS) ×2 IMPLANT
BNDG ELASTIC 6X5.8 VLCR STR LF (GAUZE/BANDAGES/DRESSINGS) ×2 IMPLANT
BNDG ESMARK 6X9 LF (GAUZE/BANDAGES/DRESSINGS) ×2
CANISTER SUCT 1200ML W/VALVE (MISCELLANEOUS) ×2 IMPLANT
CHLORAPREP W/TINT 26 (MISCELLANEOUS) ×2 IMPLANT
COVER BACK TABLE 60X90IN (DRAPES) ×2 IMPLANT
CUFF TOURN SGL QUICK 34 (TOURNIQUET CUFF) ×2
CUFF TRNQT CYL 34X4.125X (TOURNIQUET CUFF) ×1 IMPLANT
DRAPE EXTREMITY T 121X128X90 (DISPOSABLE) ×2 IMPLANT
DRAPE OEC MINIVIEW 54X84 (DRAPES) IMPLANT
DRAPE U-SHAPE 47X51 STRL (DRAPES) ×2 IMPLANT
DRSG MEPITEL 4X7.2 (GAUZE/BANDAGES/DRESSINGS) ×2 IMPLANT
DRSG PAD ABDOMINAL 8X10 ST (GAUZE/BANDAGES/DRESSINGS) ×4 IMPLANT
ELECT REM PT RETURN 9FT ADLT (ELECTROSURGICAL) ×2
ELECTRODE REM PT RTRN 9FT ADLT (ELECTROSURGICAL) ×1 IMPLANT
GAUZE SPONGE 4X4 12PLY STRL (GAUZE/BANDAGES/DRESSINGS) ×2 IMPLANT
GLOVE BIO SURGEON STRL SZ8 (GLOVE) ×2 IMPLANT
GLOVE BIOGEL PI IND STRL 7.0 (GLOVE) IMPLANT
GLOVE BIOGEL PI IND STRL 8 (GLOVE) ×2 IMPLANT
GLOVE BIOGEL PI INDICATOR 7.0 (GLOVE) ×3
GLOVE BIOGEL PI INDICATOR 8 (GLOVE) ×2
GLOVE ECLIPSE 8.0 STRL XLNG CF (GLOVE) ×2 IMPLANT
GLOVE SURG SS PI 6.5 STRL IVOR (GLOVE) ×1 IMPLANT
GLOVE SURG SS PI 7.0 STRL IVOR (GLOVE) ×1 IMPLANT
GOWN STRL REUS W/ TWL LRG LVL3 (GOWN DISPOSABLE) ×1 IMPLANT
GOWN STRL REUS W/ TWL XL LVL3 (GOWN DISPOSABLE) ×2 IMPLANT
GOWN STRL REUS W/TWL LRG LVL3 (GOWN DISPOSABLE) ×4
GOWN STRL REUS W/TWL XL LVL3 (GOWN DISPOSABLE) ×4
IMPL TAPESTRY BIOINTEGR 70X50 (Orthopedic Implant) IMPLANT
IMPL TAPESTRY BIOINTGRTV 70X50 (Orthopedic Implant) ×1 IMPLANT
KIT DRILL ACCESSORY 6 (BIT) ×1 IMPLANT
KIT TRANSTIBIAL (DISPOSABLE) ×1 IMPLANT
NDL HYPO 25X1 1.5 SAFETY (NEEDLE) IMPLANT
NDL SUT 6 .5 CRC .975X.05 MAYO (NEEDLE) IMPLANT
NEEDLE HYPO 22GX1.5 SAFETY (NEEDLE) IMPLANT
NEEDLE HYPO 25X1 1.5 SAFETY (NEEDLE) IMPLANT
NEEDLE MAYO TAPER (NEEDLE) ×2
PACK BASIN DAY SURGERY FS (CUSTOM PROCEDURE TRAY) ×2 IMPLANT
PAD CAST 4YDX4 CTTN HI CHSV (CAST SUPPLIES) ×1 IMPLANT
PADDING CAST ABS 4INX4YD NS (CAST SUPPLIES)
PADDING CAST ABS COTTON 4X4 ST (CAST SUPPLIES) IMPLANT
PADDING CAST COTTON 4X4 STRL (CAST SUPPLIES) ×2
PADDING CAST COTTON 6X4 STRL (CAST SUPPLIES) ×2 IMPLANT
PASSER SUT SWANSON 36MM LOOP (INSTRUMENTS) IMPLANT
PENCIL SMOKE EVACUATOR (MISCELLANEOUS) ×2 IMPLANT
RETRIEVER SUT HEWSON (MISCELLANEOUS) IMPLANT
SANITIZER HAND PURELL 535ML FO (MISCELLANEOUS) ×2 IMPLANT
SCREW QUOTTRO BOLT 7X14 (Screw) ×1 IMPLANT
SHEET MEDIUM DRAPE 40X70 STRL (DRAPES) ×2 IMPLANT
SLEEVE SCD COMPRESS KNEE MED (STOCKING) ×2 IMPLANT
SPIKE FLUID TRANSFER (MISCELLANEOUS) IMPLANT
SPLINT FAST PLASTER 5X30 (CAST SUPPLIES) ×20
SPLINT PLASTER CAST FAST 5X30 (CAST SUPPLIES) ×20 IMPLANT
SPONGE T-LAP 18X18 ~~LOC~~+RFID (SPONGE) ×2 IMPLANT
STOCKINETTE 6  STRL (DRAPES) ×2
STOCKINETTE 6 STRL (DRAPES) ×1 IMPLANT
SUCTION FRAZIER HANDLE 10FR (MISCELLANEOUS) ×2
SUCTION TUBE FRAZIER 10FR DISP (MISCELLANEOUS) ×1 IMPLANT
SUT ETHIBOND 0 MO6 C/R (SUTURE) IMPLANT
SUT ETHILON 3 0 PS 1 (SUTURE) ×3 IMPLANT
SUT FIBERWIRE #2 38 T-5 BLUE (SUTURE)
SUT MERSILENE 2.0 SH NDLE (SUTURE) IMPLANT
SUT MNCRL AB 3-0 PS2 18 (SUTURE) ×2 IMPLANT
SUT VIC AB 1 CT1 27 (SUTURE) ×6
SUT VIC AB 1 CT1 27XBRD ANBCTR (SUTURE) IMPLANT
SUT VIC AB 2-0 SH 27 (SUTURE) ×2
SUT VIC AB 2-0 SH 27XBRD (SUTURE) IMPLANT
SUT VICRYL 0 SH 27 (SUTURE) ×2 IMPLANT
SUT VICRYL 0 UR6 27IN ABS (SUTURE) ×1 IMPLANT
SUTURE FIBERWR #2 38 T-5 BLUE (SUTURE) IMPLANT
SUTURE TAPE 1.3 FIBERLOP 20 ST (SUTURE) IMPLANT
SUTURETAPE 1.3 FIBERLOOP 20 ST (SUTURE)
SYR BULB EAR ULCER 3OZ GRN STR (SYRINGE) ×2 IMPLANT
SYR CONTROL 10ML LL (SYRINGE) IMPLANT
TOWEL GREEN STERILE FF (TOWEL DISPOSABLE) ×4 IMPLANT
TUBE CONNECTING 20X1/4 (TUBING) ×2 IMPLANT
UNDERPAD 30X36 HEAVY ABSORB (UNDERPADS AND DIAPERS) ×2 IMPLANT
YANKAUER SUCT BULB TIP NO VENT (SUCTIONS) ×1 IMPLANT

## 2022-02-04 NOTE — Discharge Instructions (Addendum)
Toni Arthurs, MD [facility]  Please read the following information regarding your care after surgery.  Medications  You only need a prescription for the narcotic pain medicine (ex. oxycodone, Percocet, Norco).  All of the other medicines listed below are available over the counter. ? Aleve 2 pills twice a day for the first 3 days after surgery. ? acetominophen (Tylenol) 650 mg every 4-6 hours as you need for minor to moderate pain ? oxycodone as prescribed for severe pain  Narcotic pain medicine (ex. oxycodone, Percocet, Vicodin) will cause constipation.  To prevent this problem, take the following medicines while you are taking any pain medicine. ? docusate sodium (Colace) 100 mg twice a day ? senna (Senokot) 2 tablets twice a day  ? To help prevent blood clots, xarelto as prescribed for two weeks after surgery.  You should also get up every hour while you are awake to move around.    Weight Bearing ? Do not bear any weight on the operated leg or foot.  Cast / Splint / Dressing ? Keep your splint, cast or dressing clean and dry.  Don't put anything (coat hanger, pencil, etc) down inside of it.  If it gets damp, use a hair dryer on the cool setting to dry it.  If it gets soaked, call the office to schedule an appointment for a cast change.   After your dressing, cast or splint is removed; you may shower, but do not soak or scrub the wound.  Allow the water to run over it, and then gently pat it dry.  Swelling It is normal for you to have swelling where you had surgery.  To reduce swelling and pain, keep your toes above your nose for at least 3 days after surgery.  It may be necessary to keep your foot or leg elevated for several weeks.  If it hurts, it should be elevated.  Follow Up Call my office at [phone] when you are discharged from the hospital or surgery center to schedule an appointment to be seen two weeks after surgery.  Call my office at [phone] if you develop a  fever >101.5 F, nausea, vomiting, bleeding from the surgical site or severe pain.      May take Tylenol after 4pm, if needed.  May take NSAIDS (ibuprofen, motrin) after 4pm, if needed.    Post Anesthesia Home Care Instructions  Activity: Get plenty of rest for the remainder of the day. A responsible individual must stay with you for 24 hours following the procedure.  For the next 24 hours, DO NOT: -Drive a car -Advertising copywriter -Drink alcoholic beverages -Take any medication unless instructed by your physician -Make any legal decisions or sign important papers.  Meals: Start with liquid foods such as gelatin or soup. Progress to regular foods as tolerated. Avoid greasy, spicy, heavy foods. If nausea and/or vomiting occur, drink only clear liquids until the nausea and/or vomiting subsides. Call your physician if vomiting continues.  Special Instructions/Symptoms: Your throat may feel dry or sore from the anesthesia or the breathing tube placed in your throat during surgery. If this causes discomfort, gargle with warm salt water. The discomfort should disappear within 24 hours.  If you had a scopolamine patch placed behind your ear for the management of post- operative nausea and/or vomiting:  1. The medication in the patch is effective for 72 hours, after which it should be removed.  Wrap patch in a tissue and discard in the trash. Wash hands thoroughly with soap  and water. 2. You may remove the patch earlier than 72 hours if you experience unpleasant side effects which may include dry mouth, dizziness or visual disturbances. 3. Avoid touching the patch. Wash your hands with soap and water after contact with the patch.  Regional Anesthesia Blocks  1. Numbness or the inability to move the "blocked" extremity may last from 3-48 hours after placement. The length of time depends on the medication injected and your individual response to the medication. If the numbness is not going away  after 48 hours, call your surgeon.  2. The extremity that is blocked will need to be protected until the numbness is gone and the  Strength has returned. Because you cannot feel it, you will need to take extra care to avoid injury. Because it may be weak, you may have difficulty moving it or using it. You may not know what position it is in without looking at it while the block is in effect.  3. For blocks in the legs and feet, returning to weight bearing and walking needs to be done carefully. You will need to wait until the numbness is entirely gone and the strength has returned. You should be able to move your leg and foot normally before you try and bear weight or walk. You will need someone to be with you when you first try to ensure you do not fall and possibly risk injury.  4. Bruising and tenderness at the needle site are common side effects and will resolve in a few days.  5. Persistent numbness or new problems with movement should be communicated to the surgeon or the [facility] [phone] [facility] [phone]).

## 2022-02-04 NOTE — Op Note (Signed)
02/04/2022  1:17 PM  PATIENT:  Deanna Simpson  56 y.o. female  PRE-OPERATIVE DIAGNOSIS:  rupture of right achilles tendon  POST-OPERATIVE DIAGNOSIS:  Rupture of right achilles tendon  Procedure(s): 1.  Deep transfer of right flexor hallucis longus tendon to the calcaneus 2.  Right Achilles tendon reconstruction with allograft augmentation   SURGEON:  Toni Arthurs, MD  ASSISTANT: None  ANESTHESIA:   General, regional  EBL:  minimal   TOURNIQUET:   Total Tourniquet Time Documented: Thigh (Right) - 80 minutes Total: Thigh (Right) - 80 minutes  COMPLICATIONS:  None apparent  DISPOSITION:  Extubated, awake and stable to recovery.  INDICATION FOR PROCEDURE: 56 year old female without significant past medical history ruptured her right Achilles tendon at work several months ago.  She underwent repair of the Achilles rupture and excision of the avulsed calcaneus.  She was doing well postoperatively until she leaned forward in the cam boot and felt a pop in her posterior ankle.  An MRI confirms rupture through the tendon proximal to the repair site.  She presents now for revision reconstruction and transfer of the FHL tendon to the calcaneus.  The risks and benefits of the alternative treatment options have been discussed in detail.  The patient wishes to proceed with surgery and specifically understands risks of bleeding, infection, nerve damage, blood clots, need for additional surgery, amputation and death.   PROCEDURE IN DETAIL: After preoperative consent was obtained and the correct operative site was identified, the patient was brought the operating room supine on a stretcher.  General anesthesia was induced.  Preoperative antibiotics were administered.  Surgical timeout was taken.  The right lower extremity was exsanguinated and a thigh tourniquet inflated.  The patient was then turned into the prone position on the operating table with all bony prominences padded well.  The right lower  extremity was prepped and draped in standard sterile fashion.  The previous surgical incision at the posterior ankle was identified.  It was opened again sharply and dissection carried sharply down through the subcutaneous tissues.  The incision was extended proximally by several centimeters.  This allowed identification of the peritenon layer.  Dissection was then carried distally between the peritenon and the Achilles tendon and surrounding scar tissue to the level of the rupture.  The proximal stump was mobilized and freed from the peritenon proximally.  Dissection was then carried along with the medial aspect of the fibula until the FHL muscle was identified.  Dissection was carried distally along the FHL muscle until the tendon was identified.  The tibial nerve was also identified.  It was protected as dissection was carried along the tendon into the tarsal tunnel.  The ankle and hallux were maximally plantarflexed.  The FHL tendon was transected within the tunnel taking care to protect the adjacent nerve and artery.  A whipstitch was then placed in the FHL tendon.  The calcaneus was identified just anterior to the Achilles insertion.  A 6 mm drill hole was then made through the calcaneus into the plantar aspect of the foot.  A Beath pin was used to pull the tendon down into the drill hole.  The suture was then tagged for later fixation of the tendon to the bone.  Attention was then turned back to the rupture site of the tendon.  All hematoma and fibrous tissue were debrided with a rondure.  The tendon ends were freshened with a scalpel.  The tendon ends were then approximated.  #1 Vicryl suture was  placed in a box fashion 1 cm and then 2 cm and then 3 cm from the rupture site.  The sutures were then securely tied after approximating the tendon ends.  The proximal tendon was further repaired with horizontal mattress sutures of 0 Vicryl.  A 5 cm x 7 cm Zimmer [employer]omet tapestry allograft augment was placed around  the tendon circumferentially.  It was then sewn in place with a 3-0 Monocryl running Silfverskiold suture.    The FHL was appropriately tensioned.  A 7 mm Cay[place] medical bolt was inserted and tightened securely into the tunnel.  The wound was then irrigated copiously.  Vancomycin powder was sprinkled in the wound.  The peritenon and deep subcutaneous tissues were approximated with inverted simple sutures of 2-0 Vicryl.  The skin incision was closed with horizontal mattress sutures of 3-0 nylon.  Sterile dressings were applied followed by a well-padded short leg splint with the ankle in gravity equinus.  The tourniquet was released after application of the dressings.  The patient was awakened from anesthesia and transported to the recovery room in stable condition.   FOLLOW UP PLAN: Nonweightbearing on the right lower extremity.  Follow-up in the office in 2 weeks for suture removal and conversion to a short leg cast.  Plan 6 weeks nonweightbearing immobilization postoperatively as we gradually dorsiflex her ankle to neutral.  Xarelto for DVT prophylaxis.

## 2022-02-04 NOTE — Progress Notes (Signed)
Repeat popliteal block with Dr. Krista Blue in PACU. VSS. Patient resting comfortably.

## 2022-02-04 NOTE — Progress Notes (Signed)
Assisted Dr. Singer with right, popliteal, ultrasound guided block. Side rails up, monitors on throughout procedure. See vital signs in flow sheet. Tolerated Procedure well. 

## 2022-02-04 NOTE — H&P (Signed)
Deanna Simpson is an 56 y.o. female.   Chief Complaint: right ankle pain HPI: 56 year old female is now a few months status post Achilles tendon repair.  She ruptured her tendon just over a week ago.  MRI confirms complete rupture.  She presents now for revision repair and FHL transfer to the calcaneus.  Past Medical History:  Diagnosis Date   Achilles tendon rupture, right, initial encounter    Allergy    Anxiety    Complication of anesthesia    Histerics   Depression    Hyperlipemia    Interstitial cystitis    LBBB (left bundle branch block)    workup in [place] in Care everywhere    Past Surgical History:  Procedure Laterality Date   ACHILLES TENDON SURGERY Right 11/17/2021   Procedure: RIGHT ACHILLES TENDON RECONSTRUCTION;  Surgeon: Toni Arthurs, MD;  Location: [facility];  Service: Orthopedics;  Laterality: Right;   BREAST SURGERY     reduction   GASTROCNEMIUS RECESSION Right 11/17/2021   Procedure: RIGHT GASTROCNEMIUS RECESSION;  Surgeon: Toni Arthurs, MD;  Location: [facility];  Service: Orthopedics;  Laterality: Right;   ORIF CALCANEOUS FRACTURE Right 11/17/2021   Procedure: right open treatment of calcaneus fracture;  Surgeon: Toni Arthurs, MD;  Location: [facility];  Service: Orthopedics;  Laterality: Right;    TUBAL LIGATION      Family History  Problem Relation Age of Onset   Heart disease Mother        Mitral valve prolapse   Hyperlipidemia Father    Heart disease Brother    Heart disease Maternal Grandmother    Heart disease Maternal Grandfather    Social History:  reports that she has never smoked. She has never used smokeless tobacco. She reports that she does not drink alcohol and does not use drugs.  Allergies:  Allergies  Allergen Reactions   Antihistamines, Loratadine-Type     Heart races.   Influenza Vaccine Live Swelling    Vaccine causes swelling and pain at injection site   Pneumococcal Vaccines  Swelling    Causes swelling and pain at injection site.    Medications Prior to Admission  Medication Sig Dispense Refill   ARIPiprazole (ABILIFY) 5 MG tablet Take 5 mg by mouth daily.     cholecalciferol (VITAMIN D) 1000 UNITS tablet Take 1,000 Units by mouth daily.     DULoxetine (CYMBALTA) 30 MG capsule Take 1 capsule (30 mg total) by mouth daily. 90 capsule 1   lamoTRIgine (LAMICTAL) 200 MG tablet TAKE 1 TABLET BY MOUTH DAILY 60 tablet 0   Multiple Vitamin (MULTIVITAMIN ADULT PO) Take by mouth.     rOPINIRole (REQUIP) 1 MG tablet Take 1 mg by mouth at bedtime.     Amphetamine ER (ADZENYS XR-ODT) 12.5 MG TBED Take by mouth.     docusate sodium (COLACE) 100 MG capsule Take 1 capsule (100 mg total) by mouth 2 (two) times daily. While taking narcotic pain medicine. 30 capsule 0   senna (SENOKOT) 8.6 MG TABS tablet Take 2 tablets (17.2 mg total) by mouth 2 (two) times daily. 30 tablet 0    No results found for this or any previous visit (from the past 48 hour(s)). No results found.  Review of Systems no recent fever, chills, nausea, vomiting or changes in her appetite  Blood pressure 125/67, pulse 66, temperature 98.2 F (36.8 C), temperature source Oral, resp. rate 13, height 5' 3.5" (1.613 m), weight 112.6 kg,  last menstrual period 07/29/2013, SpO2 93 %. Physical Exam  Well-nourished well-developed woman in no apparent distress.  Alert and oriented.  Normal mood and affect.  Surgical incision at the right posterior ankle is healed.  4 out of 5 strength in plantarflexion.  Swelling and bruising have resolved.  Intact sensibility to light touch at the posterior heel.   Assessment/Plan Right Achilles tendon rupture -to the operating room today for revision reconstruction and FHL transfer to the calcaneus.  The risks and benefits of the alternative treatment options have been discussed in detail.  The patient wishes to proceed with surgery and specifically understands risks of bleeding,  infection, nerve damage, blood clots, need for additional surgery, amputation and death.   Toni Arthurs, MD 02-16-2022, 11:34 AM

## 2022-02-04 NOTE — Transfer of Care (Signed)
Immediate Anesthesia Transfer of Care Note  Patient: Deanna Simpson  Procedure(s) Performed: ACHILLES TENDON RECONSTRUCTION (Right: Foot) FLEXOR HALLUX LONGUS TRANSFER TO CALCANEUS (Right: Foot)  Patient Location: PACU  Anesthesia Type:General  Level of Consciousness: drowsy, patient cooperative and responds to stimulation  Airway & Oxygen Therapy: Patient Spontanous Breathing and Patient connected to face mask oxygen  Post-op Assessment: Report given to RN and Post -op Vital signs reviewed and stable  Post vital signs: Reviewed and stable  Last Vitals:  Vitals Value Taken Time  BP    Temp    Pulse    Resp    SpO2      Last Pain:  Vitals:   02/04/22 0952  TempSrc: Oral  PainSc: 0-No pain      Patients Stated Pain Goal: 3 (02/04/22 0952)  Complications: No notable events documented.

## 2022-02-04 NOTE — Anesthesia Postprocedure Evaluation (Signed)
Anesthesia Post Note  Patient: Deanna Simpson  Procedure(s) Performed: ACHILLES TENDON RECONSTRUCTION (Right: Foot) FLEXOR HALLUX LONGUS TRANSFER TO CALCANEUS (Right: Foot)     Patient location during evaluation: PACU Anesthesia Type: General Level of consciousness: sedated Pain management: pain level controlled Vital Signs Assessment: post-procedure vital signs reviewed and stable Respiratory status: spontaneous breathing and respiratory function stable Cardiovascular status: stable Postop Assessment: no apparent nausea or vomiting Anesthetic complications: no   No notable events documented.  Last Vitals:  Vitals:   02/04/22 1430 02/04/22 1501  BP: 117/74 139/72  Pulse: 67 72  Resp: 19 16  Temp:  (!) 36.2 C  SpO2: 93% 97%    Last Pain:  Vitals:   02/04/22 1501  TempSrc: Oral  PainSc: 5                  Koda Routon DANIEL

## 2022-02-04 NOTE — Anesthesia Procedure Notes (Signed)
Procedure Name: Intubation Date/Time: 02/04/2022 11:40 AM  Performed by: Thornell Mule, CRNAPre-anesthesia Checklist: Patient identified, Emergency Drugs available, Suction available and Patient being monitored Patient Re-evaluated:Patient Re-evaluated prior to induction Oxygen Delivery Method: Circle system utilized Preoxygenation: Pre-oxygenation with 100% oxygen Induction Type: IV induction Ventilation: Mask ventilation without difficulty Laryngoscope Size: Miller and 3 Grade View: Grade I Tube type: Oral Tube size: 7.0 mm Number of attempts: 1 Airway Equipment and Method: Stylet and Oral airway Placement Confirmation: ETT inserted through vocal cords under direct vision, positive ETCO2 and breath sounds checked- equal and bilateral Secured at: 20 cm Tube secured with: Tape Dental Injury: Teeth and Oropharynx as per pre-operative assessment

## 2022-02-04 NOTE — Anesthesia Procedure Notes (Signed)
Anesthesia Regional Block: Popliteal block   Pre-Anesthetic Checklist: , timeout performed,  Correct Patient, Correct Site, Correct Laterality,  Correct Procedure, Correct Position, site marked,  Risks and benefits discussed,  Surgical consent,  Pre-op evaluation,  At surgeon's request and post-op pain management  Laterality: Right  Prep: chloraprep       Needles:  Injection technique: Single-shot  Needle Type: Echogenic Stimulator Needle          Additional Needles:   Procedures:,,,, ultrasound used (permanent image in chart),,    Narrative:  Start time: 02/04/2022 10:26 AM End time: 02/04/2022 10:36 AM Injection made incrementally with aspirations every 5 mL.  Performed by: Personally  Anesthesiologist: Heather Roberts, MD  Additional Notes: A functioning IV was confirmed and monitors were applied.  Sterile prep and drape, hand hygiene and sterile gloves were used.  Negative aspiration and test dose prior to incremental administration of local anesthetic. The patient tolerated the procedure well.Ultrasound  guidance: relevant anatomy identified, needle position confirmed, local anesthetic spread visualized around nerve(s), vascular puncture avoided.  Image printed for medical record.

## 2022-02-04 NOTE — Anesthesia Procedure Notes (Signed)
Anesthesia Regional Block: Popliteal block   Pre-Anesthetic Checklist: , timeout performed,  Correct Patient, Correct Site, Correct Laterality,  Correct Procedure, Correct Position, site marked,  Risks and benefits discussed,  Surgical consent,  Pre-op evaluation,  At surgeon's request and post-op pain management  Laterality: Right  Prep: chloraprep       Needles:  Injection technique: Single-shot  Needle Type: Echogenic Stimulator Needle          Additional Needles:   Procedures:,,,, ultrasound used (permanent image in chart),,    Narrative:  Start time: 02/04/2022 1:58 PM End time: 02/04/2022 2:06 PM Injection made incrementally with aspirations every 5 mL.  Performed by: Personally  Anesthesiologist: Heather Roberts, MD  Additional Notes: A functioning IV was confirmed and monitors were applied.  Sterile prep and drape, hand hygiene and sterile gloves were used.  Negative aspiration and test dose prior to incremental administration of local anesthetic. The patient tolerated the procedure well.Ultrasound  guidance: relevant anatomy identified, needle position confirmed, local anesthetic spread visualized around nerve(s), vascular puncture avoided.  Image printed for medical record. Pt reported pain in PACU, rescue block performed.

## 2022-02-05 ENCOUNTER — Encounter (HOSPITAL_BASED_OUTPATIENT_CLINIC_OR_DEPARTMENT_OTHER): Payer: Self-pay | Admitting: Orthopedic Surgery

## 2022-02-10 ENCOUNTER — Ambulatory Visit: Payer: 59 | Admitting: Nurse Practitioner

## 2022-02-15 ENCOUNTER — Telehealth: Payer: Self-pay | Admitting: Obstetrics and Gynecology

## 2022-02-21 NOTE — Progress Notes (Deleted)
Established patient visit   Patient: Deanna Simpson   DOB: 13-Feb-1966   56 y.o. Female  MRN: [id number] Visit Date: 02/22/2022   No chief complaint on file.  Subjective    HPI  ***   Medications: Outpatient Medications Prior to Visit  Medication Sig   Amphetamine ER (ADZENYS XR-ODT) 12.5 MG TBED Take by mouth.   ARIPiprazole (ABILIFY) 5 MG tablet Take 5 mg by mouth daily.   cholecalciferol (VITAMIN D) 1000 UNITS tablet Take 1,000 Units by mouth daily.   docusate sodium (COLACE) 100 MG capsule Take 1 capsule (100 mg total) by mouth 2 (two) times daily. While taking narcotic pain medicine.   DULoxetine (CYMBALTA) 30 MG capsule Take 1 capsule (30 mg total) by mouth daily.   lamoTRIgine (LAMICTAL) 200 MG tablet TAKE 1 TABLET BY MOUTH DAILY   Multiple Vitamin (MULTIVITAMIN ADULT PO) Take by mouth.   oxyCODONE (ROXICODONE) 5 MG immediate release tablet Take 1 tablet (5 mg total) by mouth every 4 (four) hours as needed for severe pain.   rivaroxaban (XARELTO) 10 MG TABS tablet Take 1 tablet (10 mg total) by mouth daily.   rOPINIRole (REQUIP) 1 MG tablet Take 1 mg by mouth at bedtime.   senna (SENOKOT) 8.6 MG TABS tablet Take 2 tablets (17.2 mg total) by mouth 2 (two) times daily.   No facility-administered medications prior to visit.    Review of Systems  {Labs (Optional):23779}   Objective    LMP 07/29/2013  BP Readings from Last 3 Encounters:  02/04/22 139/72  11/17/21 (!) 155/72  10/05/21 (!) 146/91    Wt Readings from Last 3 Encounters:  02/04/22 248 lb 3.8 oz (112.6 kg)  11/17/21 234 lb 5.6 oz (106.3 kg)  10/05/21 231 lb 8 oz (105 kg)    Physical Exam  ***  No results found for any visits on 02/22/22.  Assessment & Plan     Problem List Items Addressed This Visit   None    No follow-ups on file.         Carlean Jews, NP  [facility] [address] [phone] (phone) [phone] (fax)  [facility]

## 2022-02-22 ENCOUNTER — Ambulatory Visit (INDEPENDENT_AMBULATORY_CARE_PROVIDER_SITE_OTHER): Payer: 59 | Admitting: Nurse Practitioner

## 2022-02-22 ENCOUNTER — Encounter: Payer: Self-pay | Admitting: Nurse Practitioner

## 2022-02-22 VITALS — BP 131/82 | HR 85 | Ht 64.0 in | Wt 241.0 lb

## 2022-02-22 DIAGNOSIS — Z7689 Persons encountering health services in other specified circumstances: Secondary | ICD-10-CM | POA: Diagnosis not present

## 2022-02-22 DIAGNOSIS — F3177 Bipolar disorder, in partial remission, most recent episode mixed: Secondary | ICD-10-CM | POA: Diagnosis not present

## 2022-02-22 DIAGNOSIS — F988 Other specified behavioral and emotional disorders with onset usually occurring in childhood and adolescence: Secondary | ICD-10-CM

## 2022-02-22 DIAGNOSIS — G622 Polyneuropathy due to other toxic agents: Secondary | ICD-10-CM

## 2022-02-22 DIAGNOSIS — G619 Inflammatory polyneuropathy, unspecified: Secondary | ICD-10-CM

## 2022-02-22 MED ORDER — ARIPIPRAZOLE 10 MG PO TABS: 10.0000 mg | ORAL_TABLET | Freq: Every day | ORAL | 1 refills | Status: DC

## 2022-02-22 MED ORDER — LAMOTRIGINE 200 MG PO TABS: 200.0000 mg | ORAL_TABLET | Freq: Every day | ORAL | 1 refills | Status: AC

## 2022-02-22 NOTE — Progress Notes (Signed)
New Patient Office Visit  Subjective    Patient ID: Deanna Simpson, female    DOB: 10/21/1965  Age: 56 y.o. MRN: [id number]  CC:  Chief Complaint  Patient presents with   New Patient (Initial Visit)    HPI Deanna Simpson presents to establish care -Patient recently moved to the area from [place].  -has been seeing only GYN and using them as PCP.  -was diagnosed with Bipolar 2 about 15 years ago. Psychiatrist who diagnosed her has now retired. Retired about 7 years ago.  -has been taking ability and lamotrigine at same doses for a very long time. Has done well without any changes.  -she also sees [facility]. They prescribe Adzenys. She will continue to see Dr. Johnnye Sima there for continued prescriptions.   Outpatient Encounter Medications as of 02/22/2022  Medication Sig   Amphetamine ER (ADZENYS XR-ODT) 12.5 MG TBED Take by mouth.   ARIPiprazole (ABILIFY) 10 MG tablet Take 1 tablet (10 mg total) by mouth daily.   cholecalciferol (VITAMIN D) 1000 UNITS tablet Take 1,000 Units by mouth daily.   docusate sodium (COLACE) 100 MG capsule Take 1 capsule (100 mg total) by mouth 2 (two) times daily. While taking narcotic pain medicine.   DULoxetine (CYMBALTA) 30 MG capsule Take 1 capsule (30 mg total) by mouth daily.   lamoTRIgine (LAMICTAL) 200 MG tablet Take 1 tablet (200 mg total) by mouth daily.   Multiple Vitamin (MULTIVITAMIN ADULT PO) Take by mouth.   oxyCODONE (ROXICODONE) 5 MG immediate release tablet Take 1 tablet (5 mg total) by mouth every 4 (four) hours as needed for severe pain.   rivaroxaban (XARELTO) 10 MG TABS tablet Take 1 tablet (10 mg total) by mouth daily.   rOPINIRole (REQUIP) 1 MG tablet Take 1 mg by mouth at bedtime.   senna (SENOKOT) 8.6 MG TABS tablet Take 2 tablets (17.2 mg total) by mouth 2 (two) times daily.   [DISCONTINUED] ARIPiprazole (ABILIFY) 5 MG tablet Take 5 mg by mouth daily.   [DISCONTINUED] lamoTRIgine (LAMICTAL) 200 MG tablet TAKE 1  TABLET BY MOUTH DAILY   No facility-administered encounter medications on file as of 02/22/2022.    Past Medical History:  Diagnosis Date   Achilles tendon rupture, right, initial encounter    Allergy    Anxiety    Complication of anesthesia    Histerics   Depression    Hyperlipemia    Interstitial cystitis    LBBB (left bundle branch block)    workup in [place] in Care everywhere    Past Surgical History:  Procedure Laterality Date   ACHILLES TENDON SURGERY Right 11/17/2021   Procedure: RIGHT ACHILLES TENDON RECONSTRUCTION;  Surgeon: Wylene Simmer, MD;  Location: [facility];  Service: Orthopedics;  Laterality: Right;   ACHILLES TENDON SURGERY Right 02/04/2022   Procedure: ACHILLES TENDON RECONSTRUCTION;  Surgeon: Wylene Simmer, MD;  Location: [facility];  Service: Orthopedics;  Laterality: Right;   BREAST SURGERY     reduction   GASTROCNEMIUS RECESSION Right 11/17/2021   Procedure: RIGHT GASTROCNEMIUS RECESSION;  Surgeon: Wylene Simmer, MD;  Location: [facility];  Service: Orthopedics;  Laterality: Right;   ORIF CALCANEOUS FRACTURE Right 11/17/2021   Procedure: right open treatment of calcaneus fracture;  Surgeon: Wylene Simmer, MD;  Location: [facility];  Service: Orthopedics;  Laterality: Right;  128min   TUBAL LIGATION      Family History  Problem Relation Age of Onset  Heart disease Mother        Mitral valve prolapse   Hyperlipidemia Father    Heart disease Brother    Heart disease Maternal Grandmother    Heart disease Maternal Grandfather     Social History   Socioeconomic History   Marital status: Married    Spouse name: Not on file   Number of children: 3   Years of education: Not on file   Highest education level: Not on file  Occupational History   Occupation: TEACHER    Employer: [employer]  Tobacco Use   Smoking status: Never   Smokeless tobacco: Never  Vaping Use   Vaping Use: Never used   Substance and Sexual Activity   Alcohol use: No   Drug use: No   Sexual activity: Yes    Birth control/protection: Surgical, Post-menopausal    Comment: BTL  Other Topics Concern   Not on file  Social History Narrative   Not on file   Social Determinants of Health   Financial Resource Strain: Not on file  Food Insecurity: Not on file  Transportation Needs: Not on file  Physical Activity: Not on file  Stress: Not on file  Social Connections: Not on file  Intimate Partner Violence: Not on file    Review of Systems  Constitutional:  Negative for chills, fever and malaise/fatigue.  HENT:  Negative for congestion, sinus pain and sore throat.   Eyes: Negative.   Respiratory:  Negative for cough, shortness of breath and wheezing.   Cardiovascular:  Negative for chest pain, palpitations and leg swelling.  Gastrointestinal:  Negative for constipation, diarrhea, nausea and vomiting.  Genitourinary: Negative.   Musculoskeletal:  Negative for myalgias.  Skin: Negative.   Neurological:  Negative for dizziness and headaches.  Endo/Heme/Allergies:  Does not bruise/bleed easily.  Psychiatric/Behavioral:  Negative for depression. The patient is not nervous/anxious.         Objective    Today's Vitals   02/22/22 1606  BP: 131/82  Pulse: 85  SpO2: 96%  Weight: 241 lb (109.3 kg)  Height: 5\' 4"  (1.626 m)   Body mass index is 41.37 kg/m.   Physical Exam Vitals and nursing note reviewed.  Constitutional:      Appearance: Normal appearance. She is well-developed. She is obese.  HENT:     Head: Normocephalic and atraumatic.  Eyes:     Pupils: Pupils are equal, round, and reactive to light.  Cardiovascular:     Rate and Rhythm: Normal rate and regular rhythm.     Pulses: Normal pulses.     Heart sounds: Normal heart sounds.  Pulmonary:     Effort: Pulmonary effort is normal.     Breath sounds: Normal breath sounds.  Abdominal:     Palpations: Abdomen is soft.   Musculoskeletal:        General: Normal range of motion.     Cervical back: Normal range of motion and neck supple.  Lymphadenopathy:     Cervical: No cervical adenopathy.  Skin:    General: Skin is warm and dry.     Capillary Refill: Capillary refill takes less than 2 seconds.  Neurological:     General: No focal deficit present.     Mental Status: She is alert and oriented to person, place, and time.  Psychiatric:        Mood and Affect: Mood normal.        Behavior: Behavior normal.        Thought  Content: Thought content normal.        Judgment: Judgment normal.      Assessment & Plan:  1. Bipolar disorder, in partial remission, most recent episode mixed (HCC) Stable for many years. Continue ability and lamictal  as previously prescribed.  - ARIPiprazole (ABILIFY) 10 MG tablet; Take 1 tablet (10 mg total) by mouth daily.  Dispense: 90 tablet; Refill: 1 - lamoTRIgine (LAMICTAL) 200 MG tablet; Take 1 tablet (200 mg total) by mouth daily.  Dispense: 180 tablet; Refill: 1  2. Attention deficit disorder, unspecified hyperactivity presence Continue regular visits with [facility] for management   3. Inflammatory and toxic neuropathy (HCC) Continue cymbalta and requip as prescribed.   4. Encounter to establish care Appointment today to establish new primary care provider      Problem List Items Addressed This Visit       Nervous and Auditory   Inflammatory and toxic neuropathy (HCC)     Other   Attention deficit disorder   Bipolar disorder, in partial remission, most recent episode mixed (HCC) - Primary   Relevant Medications   ARIPiprazole (ABILIFY) 10 MG tablet   lamoTRIgine (LAMICTAL) 200 MG tablet   Other Visit Diagnoses     Encounter to establish care           Return in about 3 months (around 05/25/2022) for health maintenance exam - please get most recent pap results from physician for woen.   Deanna Jews, NP

## 2022-03-07 DIAGNOSIS — F3177 Bipolar disorder, in partial remission, most recent episode mixed: Secondary | ICD-10-CM | POA: Insufficient documentation

## 2022-03-18 NOTE — Telephone Encounter (Signed)
close

## 2022-03-22 ENCOUNTER — Other Ambulatory Visit: Payer: Self-pay | Admitting: Nurse Practitioner

## 2022-04-14 ENCOUNTER — Ambulatory Visit: Payer: 59 | Admitting: Neurology

## 2022-04-14 LAB — HM MAMMOGRAPHY

## 2022-04-15 LAB — LAB REPORT - SCANNED
A1c: 6.1
EGFR: 78

## 2022-05-04 ENCOUNTER — Telehealth: Payer: Self-pay | Admitting: *Deleted

## 2022-05-04 NOTE — Telephone Encounter (Addendum)
Pt is calling requesting a referral to [facility]. Patient called back and is going to fax her labs that she had done so that they can be scanned into her chart.  Laporshia Hogen Zimmerman Rumple, CMA

## 2022-05-05 NOTE — Telephone Encounter (Signed)
Contacted pt to let her know that she would need appointment.  Pt is scheduled to come in and see provider on 05/25/22.  Also while on phone pt was asking if provider could send in lasix or another diuretic.  She said she has had some surgery on her ankles and is having swelling. I asked her about the doctor that performed the surgeries and she said that they told her she would have to talk to her primary for this. Please advise. Zakaiya Lares Zimmerman Rumple, CMA

## 2022-05-05 NOTE — Telephone Encounter (Signed)
I really need t see her before I can prescribe this, especially when it is not already prescribed. I'm sorry

## 2022-05-06 NOTE — Telephone Encounter (Signed)
Awesome! Thank you!

## 2022-05-06 NOTE — Telephone Encounter (Signed)
Pt called back and I informed her of below she is scheduled to come in on Monday to talk about this medication. Deanna Simpson, CMA

## 2022-05-06 NOTE — Telephone Encounter (Signed)
LVM for pt to call office to give her the below information and to see if she would like to make an appointment to discuss this. Deanna Simpson, CMA

## 2022-05-09 NOTE — Progress Notes (Signed)
Established patient visit   Patient: Deanna Simpson   DOB: 1965/09/14   56 y.o. Female  MRN: [id number] Visit Date: 05/10/2022   Chief Complaint  Patient presents with   Follow-up   Subjective    HPI  Follow up -discuss labs and referral  to nutritionist   -would like diuretic.  -having swelling of right lower leg, ankle, and foot swelling since having achilles tendon repair 02/04/2022. -I do not see that she is on diuretic at all  -back pain since she is now out of post-operative boot.  -had achilles tendon repair 02/04/2022.  -did just come from orthopedic provider today. Was given stretching exercises to do to help manage the back pain. She does take robaxin as needed. She also takes NSAID along with tylenol twice daily which makes the pain manageable.   Medications: Outpatient Medications Prior to Visit  Medication Sig   methocarbamol (ROBAXIN) 500 MG tablet Take by mouth.   Amphetamine ER (ADZENYS XR-ODT) 12.5 MG TBED Take by mouth.   ARIPiprazole (ABILIFY) 10 MG tablet Take 1 tablet (10 mg total) by mouth daily.   cholecalciferol (VITAMIN D) 1000 UNITS tablet Take 1,000 Units by mouth daily.   DULoxetine (CYMBALTA) 30 MG capsule TAKE 1 CAPSULE (30 MG TOTAL) BY MOUTH DAILY.   lamoTRIgine (LAMICTAL) 200 MG tablet Take 1 tablet (200 mg total) by mouth daily.   Multiple Vitamin (MULTIVITAMIN ADULT PO) Take by mouth.   rivaroxaban (XARELTO) 10 MG TABS tablet Take 1 tablet (10 mg total) by mouth daily.   rOPINIRole (REQUIP) 1 MG tablet Take 1 mg by mouth at bedtime.   senna (SENOKOT) 8.6 MG TABS tablet Take 2 tablets (17.2 mg total) by mouth 2 (two) times daily.   [DISCONTINUED] docusate sodium (COLACE) 100 MG capsule Take 1 capsule (100 mg total) by mouth 2 (two) times daily. While taking narcotic pain medicine.   [DISCONTINUED] oxyCODONE (ROXICODONE) 5 MG immediate release tablet Take 1 tablet (5 mg total) by mouth every 4 (four) hours as needed for severe pain.   No  facility-administered medications prior to visit.    Review of Systems  Constitutional:  Positive for activity change. Negative for appetite change, chills, fatigue and fever.  HENT:  Negative for congestion, postnasal drip, rhinorrhea, sinus pressure, sinus pain, sneezing and sore throat.   Eyes: Negative.   Respiratory:  Negative for cough, chest tightness, shortness of breath and wheezing.   Cardiovascular:  Negative for chest pain and palpitations.       Elevated blood pressure today   Gastrointestinal:  Negative for abdominal pain, constipation, diarrhea, nausea and vomiting.  Endocrine: Negative for cold intolerance, heat intolerance, polydipsia and polyuria.  Genitourinary:  Negative for dyspareunia, dysuria, flank pain, frequency and urgency.  Musculoskeletal:  Positive for arthralgias, back pain, joint swelling and myalgias.  Skin:  Negative for rash.  Allergic/Immunologic: Negative for environmental allergies.  Neurological:  Negative for dizziness, weakness and headaches.  Hematological:  Negative for adenopathy.  Psychiatric/Behavioral:  The patient is not nervous/anxious.        Objective     Today's Vitals   05/10/22 1618  BP: (Abnormal) 140/84  Pulse: 80  SpO2: 100%  Weight: 249 lb 12.8 oz (113.3 kg)  Height: 5\' 4"  (1.626 m)   Body mass index is 42.88 kg/m.  BP Readings from Last 3 Encounters:  05/10/22 (Abnormal) 140/84  02/22/22 131/82  02/04/22 139/72    Wt Readings from Last 3 Encounters:  05/10/22 249 lb 12.8  oz (113.3 kg)  02/22/22 241 lb (109.3 kg)  02/04/22 248 lb 3.8 oz (112.6 kg)    Physical Exam Vitals and nursing note reviewed.  Constitutional:      Appearance: Normal appearance. She is well-developed.  HENT:     Head: Normocephalic and atraumatic.  Eyes:     Pupils: Pupils are equal, round, and reactive to light.  Neck:     Vascular: No carotid bruit.  Cardiovascular:     Rate and Rhythm: Normal rate and regular rhythm.      Pulses: Normal pulses.     Heart sounds: Normal heart sounds.     Comments: Elevated blood pressure and swelling in the ankles, especially the right Pulmonary:     Effort: Pulmonary effort is normal.     Breath sounds: Normal breath sounds.  Abdominal:     Palpations: Abdomen is soft.  Musculoskeletal:        General: Normal range of motion.     Cervical back: Normal range of motion and neck supple.     Right lower leg: Edema present.     Left lower leg: Edema present.     Comments: More significant on the right side.   Lymphadenopathy:     Cervical: No cervical adenopathy.  Skin:    General: Skin is warm and dry.     Capillary Refill: Capillary refill takes less than 2 seconds.  Neurological:     General: No focal deficit present.     Mental Status: She is alert and oriented to person, place, and time.  Psychiatric:        Mood and Affect: Mood normal.        Behavior: Behavior normal.        Thought Content: Thought content normal.        Judgment: Judgment normal.       Assessment & Plan    1. Lower extremity edema Trial HCTZ 12.5 mg tablets. Take 1/2 to 1 tablet daily as needed.  - hydrochlorothiazide (HYDRODIURIL) 12.5 MG tablet; Take 0.5-1 tablets (6.25-12.5 mg total) by mouth daily as needed.  Dispense: 30 tablet; Refill: 2  2. Elevated blood-pressure reading without diagnosis of hypertension Add low dose HCTZ. Discussed DASH diet and importance of increasing daily water intake.   3. Prediabetes H/o prediabetes. Will refer for diabetic nutrition education.  - Ambulatory referral to diabetic education    Problem List Items Addressed This Visit       Other   Lower extremity edema - Primary   Relevant Medications   hydrochlorothiazide (HYDRODIURIL) 12.5 MG tablet   Elevated blood-pressure reading without diagnosis of hypertension   Prediabetes   Relevant Orders   Ambulatory referral to diabetic education     Return in about 4 weeks (around 06/07/2022) for  edema - cancel 05/25/2022. thanks .         Carlean Jews, NP  [facility] [phone] (phone) [phone] (fax)  [facility]

## 2022-05-10 ENCOUNTER — Encounter: Payer: Self-pay | Admitting: Nurse Practitioner

## 2022-05-10 ENCOUNTER — Ambulatory Visit: Payer: 59 | Admitting: Nurse Practitioner

## 2022-05-10 VITALS — BP 140/84 | HR 80 | Ht 64.0 in | Wt 249.8 lb

## 2022-05-10 DIAGNOSIS — R7303 Prediabetes: Secondary | ICD-10-CM | POA: Diagnosis not present

## 2022-05-10 DIAGNOSIS — R030 Elevated blood-pressure reading, without diagnosis of hypertension: Secondary | ICD-10-CM

## 2022-05-10 DIAGNOSIS — R600 Localized edema: Secondary | ICD-10-CM

## 2022-05-10 MED ORDER — HYDROCHLOROTHIAZIDE 12.5 MG PO TABS: 6.2500 mg | ORAL_TABLET | Freq: Every day | ORAL | 2 refills | Status: DC | PRN

## 2022-05-23 DIAGNOSIS — R030 Elevated blood-pressure reading, without diagnosis of hypertension: Secondary | ICD-10-CM | POA: Insufficient documentation

## 2022-05-23 DIAGNOSIS — R7303 Prediabetes: Secondary | ICD-10-CM | POA: Insufficient documentation

## 2022-05-23 DIAGNOSIS — R600 Localized edema: Secondary | ICD-10-CM | POA: Insufficient documentation

## 2022-05-25 ENCOUNTER — Encounter: Payer: 59 | Admitting: Nurse Practitioner

## 2022-06-01 ENCOUNTER — Encounter: Payer: Self-pay | Admitting: Cardiovascular Disease

## 2022-06-01 ENCOUNTER — Ambulatory Visit: Payer: 59 | Attending: Cardiovascular Disease | Admitting: Cardiovascular Disease

## 2022-06-01 VITALS — BP 140/88 | HR 93 | Ht 64.0 in | Wt 247.8 lb

## 2022-06-01 DIAGNOSIS — R030 Elevated blood-pressure reading, without diagnosis of hypertension: Secondary | ICD-10-CM

## 2022-06-01 DIAGNOSIS — E782 Mixed hyperlipidemia: Secondary | ICD-10-CM

## 2022-06-01 DIAGNOSIS — I447 Left bundle-branch block, unspecified: Secondary | ICD-10-CM

## 2022-06-01 MED ORDER — ROSUVASTATIN CALCIUM 20 MG PO TABS: 20.0000 mg | ORAL_TABLET | Freq: Every day | ORAL | 3 refills | Status: AC

## 2022-06-01 NOTE — Progress Notes (Signed)
Cardiology Office Note:    Date:  06/01/2022   ID:  Deanna Simpson, DOB Mar 20, 1966, MRN [id number]  PCP:  Carlean Jews, NP   [facility] Providers Cardiologist:  Tuan Tippin   Referring MD: Carlean Jews, NP   Chief Complaint  Patient presents with   Hyperlipidemia    History of Present Illness:    Deanna Simpson is a 56 y.o. female with a hx of healthy 56 year old female.  She has had some recent foot surgery ( rupture of achiles tendon ) and is on Xarelto for DVT prophylaxis.  She has a strong family history of cardiac disease and wanted to be seen for restratification.  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  She has had 2 separate repairs.  Has had syncope in the past (? Possible orthostasis)    She had a normal Lexiscan Myoview study in May, 2022.  She did have mild breast attenuation artifact.  She has normal left ventricular systolic function with an EF of 62% at rest and 69% with stress.    Echocardiogram reveals normal LVEF of 60 to 65%.  She has grade 1 diastolic dysfunction.  RV is normal in size and function.  Negative bubble study.  Labs from Dr. Gaye Alken office from April 14, 2022 Total cholesterol is 224 HDL is 53 LDL is 163 Triglyceride level is 153 Hemoglobin A1c is 6.1 Creatinine is 0.880  ALT is 19  TSH is 2.6  Blood pressure remains a little elevated.  She admits to eating more salt than she should.  Past Medical History:  Diagnosis Date   Achilles tendon rupture, right, initial encounter    Allergy    Anxiety    Complication of anesthesia    Histerics   Depression    Hyperlipemia    Interstitial cystitis    LBBB (left bundle branch block)    workup in [place] in Care everywhere    Past Surgical History:  Procedure Laterality  Date   ACHILLES TENDON SURGERY Right 11/17/2021   Procedure: RIGHT ACHILLES TENDON RECONSTRUCTION;  Surgeon: Toni Arthurs, MD;  Location: [facility];  Service: Orthopedics;  Laterality: Right;   ACHILLES TENDON SURGERY Right 02/04/2022   Procedure: ACHILLES TENDON RECONSTRUCTION;  Surgeon: Toni Arthurs, MD;  Location: [facility];  Service: Orthopedics;  Laterality: Right;   BREAST SURGERY     reduction   GASTROCNEMIUS RECESSION Right 11/17/2021   Procedure: RIGHT GASTROCNEMIUS RECESSION;  Surgeon: Toni Arthurs, MD;  Location: [facility];  Service: Orthopedics;  Laterality: Right;   ORIF CALCANEOUS FRACTURE Right 11/17/2021   Procedure: right open treatment of calcaneus fracture;  Surgeon: Toni Arthurs, MD;  Location:  SURGERY CENTER;  Service: Orthopedics;  Laterality: Right;    TUBAL LIGATION      Current Medications: Current Meds  Medication Sig   Amphetamine ER (ADZENYS XR-ODT) 12.5 MG TBED Take by mouth.   ARIPiprazole (ABILIFY) 10 MG tablet Take 1 tablet (10 mg total) by mouth daily.   cholecalciferol (VITAMIN D) 1000 UNITS tablet Take 1,000 Units by mouth daily.   DULoxetine (CYMBALTA) 30 MG capsule TAKE 1 CAPSULE (30 MG TOTAL) BY MOUTH DAILY.   lamoTRIgine (LAMICTAL) 200  MG tablet Take 1 tablet (200 mg total) by mouth daily.   methocarbamol (ROBAXIN) 500 MG tablet Take by mouth.   Multiple Vitamin (MULTIVITAMIN ADULT PO) Take by mouth.   rOPINIRole (REQUIP) 1 MG tablet Take 1 mg by mouth at bedtime.   rosuvastatin (CRESTOR) 20 MG tablet Take 1 tablet (20 mg total) by mouth daily.   [DISCONTINUED] hydrochlorothiazide (HYDRODIURIL) 12.5 MG tablet Take 0.5-1 tablets (6.25-12.5 mg total) by mouth daily as needed.     Allergies:   Antihistamines, loratadine-type; Influenza vaccine live; and Pneumococcal vaccines   Social History   Socioeconomic History   Marital status: Married    Spouse name: Not on file   Number of  children: 3   Years of education: Not on file   Highest education level: Not on file  Occupational History   Occupation: TEACHER    Employer: [employer]  Tobacco Use   Smoking status: Never   Smokeless tobacco: Never  Vaping Use   Vaping Use: Never used  Substance and Sexual Activity   Alcohol use: No   Drug use: No   Sexual activity: Yes    Birth control/protection: Surgical, Post-menopausal    Comment: BTL  Other Topics Concern   Not on file  Social History Narrative   Not on file   Social Determinants of Health   Financial Resource Strain: Not on file  Food Insecurity: Not on file  Transportation Needs: Not on file  Physical Activity: Not on file  Stress: Not on file  Social Connections: Not on file     Family History: The patient's family history includes Heart disease in her brother, maternal grandfather, maternal grandmother, and mother; Hyperlipidemia in her father.  ROS:   Please see the history of present illness.     All other systems reviewed and are negative.  EKGs/Labs/Other Studies Reviewed:    The following studies were reviewed today:   EKG: Nov 16, 2021: Sinus rhythm.  Heart rate I- 89   Left bundle branch block.  Associated ST and T wave changes.  Recent Labs: No results found for requested labs within last 365 days.  Recent Lipid Panel No results found for: "CHOL", "TRIG", "HDL", "CHOLHDL", "VLDL", "LDLCALC", "LDLDIRECT"   Risk Assessment/Calculations:      HYPERTENSION CONTROL Vitals:   06/01/22 1603 06/01/22 1635  BP: (!) 150/92 (!) 140/88    The patient's blood pressure is elevated above target today.  In order to address the patient's elevated BP: Blood pressure will be monitored at home to determine if medication changes need to be made.            Physical Exam:    VS:  BP (!) 140/88   Pulse 93   Ht 5\' 4"  (1.626 m)   Wt 247 lb 12.8 oz (112.4 kg)   LMP 07/29/2013   SpO2 96%   BMI 42.53 kg/m     Wt Readings  from Last 3 Encounters:  06/01/22 247 lb 12.8 oz (112.4 kg)  05/10/22 249 lb 12.8 oz (113.3 kg)  02/22/22 241 lb (109.3 kg)     GEN: Middle-aged female, no acute distress.  Moderately obese in no acute distress HEENT: Normal NECK: No JVD; No carotid bruits LYMPHATICS: No lymphadenopathy CARDIAC: RRR, no murmurs, rubs, gallops RESPIRATORY:  Clear to auscultation without rales, wheezing or rhonchi  ABDOMEN: Soft, non-tender, non-distended MUSCULOSKELETAL:  No edema; No deformity  SKIN: Warm and dry NEUROLOGIC:  Alert and oriented x 3 PSYCHIATRIC:  Normal  affect   ASSESSMENT:    1. Mixed hyperlipidemia   2. Elevated blood-pressure reading without diagnosis of hypertension   3. LBBB (left bundle branch block)    PLAN:    In order of problems listed above:  Family history of coronary artery disease:  Excessive high cholesterol.  She has a strong family history of CAD.  Her last LDL is 163.  Will draw a lipoprotein a today.  Will get a coronary calcium score.  Start her on rosuvastatin 20 mg a day.  Based on her coronary calcium score we will determine whether the rosuvastatin alone is enough or perhaps that she need to be started on a PCSK9 inhibitor.  Currently she is not having any symptoms of angina.  2.  Hypertension: Blood pressure is a little elevated today.  She admits to eating a little more salt than she should.  I have asked her to avoid eating salt.  She will work on a good diet, exercise, weight loss program.  She will start recording her blood pressure and bring her blood pressure log back to the office when I see her again.  3. Morbid obesity: She is going to be contacting the [facility] healthy weight loss program.  I have given her some guidelines on healthy eating.  She will be able to exercise quite a bit more once her Achilles tendon heals.           Medication Adjustments/Labs and Tests Ordered: Current medicines are reviewed at length with the patient today.   Concerns regarding medicines are outlined above.  Orders Placed This Encounter  Procedures   CT CARDIAC SCORING (SELF PAY ONLY)   Lipid panel   ALT   Basic metabolic panel   Lipoprotein A (LPA)   Meds ordered this encounter  Medications   rosuvastatin (CRESTOR) 20 MG tablet    Sig: Take 1 tablet (20 mg total) by mouth daily.    Dispense:  90 tablet    Refill:  3    Patient Instructions  Adopting a Healthy Lifestyle.   Weight: Know what a healthy weight is for you (roughly BMI <25) and aim to maintain this. You can calculate your body mass index on your smart phone  Diet: Aim for 7+ servings of fruits and vegetables daily Limit animal fats in diet for cholesterol and heart health - choose grass fed whenever available Avoid highly processed foods (fast food burgers, tacos, fried chicken, pizza, hot dogs, french fries)  Saturated fat comes in the form of butter, lard, coconut oil, margarine, partially hydrogenated oils, and fat in meat. These increase your risk of cardiovascular disease.  Use healthy plant oils, such as olive, canola, soy, corn, sunflower and peanut.  Whole foods such as fruits, vegetables and whole grains have fiber  Men need > 38 grams of fiber per day Women need > 25 grams of fiber per day  Load up on vegetables and fruits - one-half of your plate: Aim for color and variety, and remember that potatoes dont count. Go for whole grains - one-quarter of your plate: Whole wheat, barley, wheat berries, quinoa, oats, brown rice, and foods made with them. If you want pasta, go with whole wheat pasta. Protein power - one-quarter of your plate: Fish, chicken, beans, and nuts are all healthy, versatile protein sources. Limit red meat. You need carbohydrates for energy! The type of carbohydrate is more important than the amount. Choose carbohydrates such as vegetables, fruits, whole grains, beans, and nuts in  the place of white rice, white pasta, potatoes (baked or fried),  macaroni and cheese, cakes, cookies, and donuts.  If youre thirsty, drink water. Coffee and tea are good in moderation, but skip sugary drinks and limit milk and dairy products to one or two daily servings. Keep sugar intake at 6 teaspoons or 24 grams or LESS       Exercise: Aim for 150 min of moderate intensity exercise weekly for heart health, and weights twice weekly for bone health Stay active - any steps are better than no steps! Aim for 7-9 hours of sleep daily   Medication Instructions:  START Rosuvastatin 20mg  daily *If you need a refill on your cardiac medications before your next appointment, please call your pharmacy*  Lab Work: LP(a) today Lipids, ALT, BMET in 3 months If you have labs (blood work) drawn today and your tests are completely normal, you will receive your results only by: MyChart Message (if you have MyChart) OR A paper copy in the mail If you have any lab test that is abnormal or we need to change your treatment, we will call you to review the results.  Testing/Procedures: Coronary Calcium Score CT Your physician has requested that you have cardiac CT. Cardiac computed tomography (CT) is a painless test that uses an x-ray machine to take clear, detailed pictures of your heart. For further information please visit . Please follow instruction sheet as given.  Follow-Up: At [facility], you and your health needs are our priority.  As part of our continuing mission to provide you with exceptional heart care, we have created designated Provider Care Teams.  These Care Teams include your primary Cardiologist (physician) and Advanced Practice Providers (APPs -  Physician Assistants and Nurse Practitioners) who all work together to provide you with the care you need, when you need it.  Your next appointment:   12/08/22 @ 11:20  The format for your next appointment:   In Person  Provider:   02/07/23, MD    Important  Information About Sugar            Signed, Kristeen Miss, MD  06/01/2022 5:48 PM    [facility]

## 2022-06-01 NOTE — Patient Instructions (Signed)
Adopting a Healthy Lifestyle.   Weight: Know what a healthy weight is for you (roughly BMI <25) and aim to maintain this. You can calculate your body mass index on your smart phone  Diet: Aim for 7+ servings of fruits and vegetables daily Limit animal fats in diet for cholesterol and heart health - choose grass fed whenever available Avoid highly processed foods (fast food burgers, tacos, fried chicken, pizza, hot dogs, french fries)  Saturated fat comes in the form of butter, lard, coconut oil, margarine, partially hydrogenated oils, and fat in meat. These increase your risk of cardiovascular disease.  Use healthy plant oils, such as olive, canola, soy, corn, sunflower and peanut.  Whole foods such as fruits, vegetables and whole grains have fiber  Men need > 38 grams of fiber per day Women need > 25 grams of fiber per day  Load up on vegetables and fruits - one-half of your plate: Aim for color and variety, and remember that potatoes dont count. Go for whole grains - one-quarter of your plate: Whole wheat, barley, wheat berries, quinoa, oats, brown rice, and foods made with them. If you want pasta, go with whole wheat pasta. Protein power - one-quarter of your plate: Fish, chicken, beans, and nuts are all healthy, versatile protein sources. Limit red meat. You need carbohydrates for energy! The type of carbohydrate is more important than the amount. Choose carbohydrates such as vegetables, fruits, whole grains, beans, and nuts in the place of white rice, white pasta, potatoes (baked or fried), macaroni and cheese, cakes, cookies, and donuts.  If youre thirsty, drink water. Coffee and tea are good in moderation, but skip sugary drinks and limit milk and dairy products to one or two daily servings. Keep sugar intake at 6 teaspoons or 24 grams or LESS       Exercise: Aim for 150 min of moderate intensity exercise weekly for heart health, and weights twice weekly for bone health Stay active -  any steps are better than no steps! Aim for 7-9 hours of sleep daily   Medication Instructions:  START Rosuvastatin 20mg  daily *If you need a refill on your cardiac medications before your next appointment, please call your pharmacy*  Lab Work: LP(a) today Lipids, ALT, BMET in 3 months If you have labs (blood work) drawn today and your tests are completely normal, you will receive your results only by: MyChart Message (if you have MyChart) OR A paper copy in the mail If you have any lab test that is abnormal or we need to change your treatment, we will call you to review the results.  Testing/Procedures: Coronary Calcium Score CT Your physician has requested that you have cardiac CT. Cardiac computed tomography (CT) is a painless test that uses an x-ray machine to take clear, detailed pictures of your heart. For further information please visit . Please follow instruction sheet as given.  Follow-Up: At [facility], you and your health needs are our priority.  As part of our continuing mission to provide you with exceptional heart care, we have created designated Provider Care Teams.  These Care Teams include your primary Cardiologist (physician) and Advanced Practice Providers (APPs -  Physician Assistants and Nurse Practitioners) who all work together to provide you with the care you need, when you need it.  Your next appointment:   12/08/22 @ 11:20  The format for your next appointment:   In Person  Provider:   02/07/23, MD    Important Information  About Sugar

## 2022-06-02 LAB — LIPOPROTEIN A (LPA): Lipoprotein (a): 39.5 nmol/L (ref <75.0)

## 2022-06-15 LAB — LAB REPORT - SCANNED: A1c: 6.3

## 2022-06-17 ENCOUNTER — Encounter: Payer: 59 | Attending: Nurse Practitioner | Admitting: Dietician

## 2022-06-17 ENCOUNTER — Encounter: Payer: Self-pay | Admitting: Dietician

## 2022-06-17 VITALS — Ht 64.0 in

## 2022-06-17 DIAGNOSIS — R7303 Prediabetes: Secondary | ICD-10-CM | POA: Insufficient documentation

## 2022-06-17 DIAGNOSIS — E782 Mixed hyperlipidemia: Secondary | ICD-10-CM | POA: Insufficient documentation

## 2022-06-17 NOTE — Progress Notes (Signed)
Medical Nutrition Therapy  Appointment Start time:  [phone]  Appointment End time:  0845  Primary concerns today: Pt wants to prevent diabetes.   Referral diagnosis: prediabetes Preferred learning style: no preference indicated Learning readiness: ready   NUTRITION ASSESSMENT   Anthropometrics  Ht: 64in Wt: not assessed  Clinical Medical Hx: allergies, anxiety, depression, HLD.  Medications: reviewed Labs: reviewed- 03/2022: chol 244, trig 153, LDL 163, 05/2022 A1c 6.3% Notable Signs/Symptoms: none indicated Food Allergies: none  Lifestyle & Dietary Hx  Pt states she ruptured her achilles tendon in may 2023, which prevented her from being able to exercise and she just started PT a few weeks ago and is cleared for walking and elliptical.  Pt has home gym including treadmill, elliptical, and weights.   Pt works at [employer]. Pt sometimes only has time to eat half her lunch in which she will eat the other half later for a snack.   Pt states her husband works from home and eats at [place], so she does not usually cook dinner and might just make something simple for herself.   Pt [place] a walking track.   Pt states she wants to lose 15lbs by June.  Pt states the only raw vegetables she usually enjoys are salads but she enjoys a lot of cooked vegetables.   Estimated daily fluid intake: 16 oz Supplements: MVI, vitamin D Sleep: 9 hours, pt sleeps good and falls asleep fast.  Stress / self-care: moderate stress Current average weekly physical activity: ADLs  24-Hr Dietary Recall First Meal: raisin bran cereal and skim milk with orange juice Snack: grapes Second Meal: 1/2 of a peanut butter and jelly OR [employer] bread [place] sandwich with fruit and laughing cow cheese and wheat thins with boiled egg Snack: yogurt and leftovers from lunch Third Meal: ham and mac and cheese OR sandwich  Snack: none Beverages: orange juice 8 oz, 16 oz water, sparkling ice 2/day,     NUTRITION DIAGNOSIS  [place]-2.2 Altered nutrition-related laboratory As related to prediabetes.  As evidenced by A1c 6.3%.   NUTRITION INTERVENTION  Nutrition education (E-1) on the following topics:  Impact of stress on blood sugar Insulin resistance A1c and prediabetes Benefits of physical activity Importance of adequate hydration MyPlate Building balanced meals and snacks Energy balance   Handouts Provided Include  Dish Up a Healthy Meal Nutrition Care Manual: LDL-Lowering Nutrition Therapy Snack sheet  Learning Style & Readiness for Change Teaching method utilized: Visual & Auditory  Demonstrated degree of understanding via: Teach Back  Barriers to learning/adherence to lifestyle change: none  Goals Established by Pt Aim for 150 minutes of physical activity weekly. Goal: Start using the elliptical for 15 minutes 3x/wk.   Eat more Non-Starchy Vegetables. Goal: aim to include vegetables in 2 meals per day.   Goal: aim to drink 3 water bottles per day.   MONITORING & EVALUATION Dietary intake, weekly physical activity, and follow up in 2 months.  Next Steps  Patient is to call for questions.

## 2022-06-17 NOTE — Patient Instructions (Addendum)
Aim for 150 minutes of physical activity weekly. Goal: Start using the elliptical for 15 minutes 3x/wk.   Eat more Non-Starchy Vegetables. Goal: aim to include vegetables in 2 meals per day.   Goal: aim to drink 3 water bottles per day.  Choose whole foods over processed.  Consider asking if an omega 3 supplement is appropriate.

## 2022-06-23 ENCOUNTER — Ambulatory Visit: Payer: 59 | Admitting: Nurse Practitioner

## 2022-06-23 ENCOUNTER — Encounter: Payer: Self-pay | Admitting: Nurse Practitioner

## 2022-06-23 VITALS — BP 132/85 | HR 87 | Ht 64.0 in | Wt 246.8 lb

## 2022-06-23 DIAGNOSIS — F3177 Bipolar disorder, in partial remission, most recent episode mixed: Secondary | ICD-10-CM

## 2022-06-23 DIAGNOSIS — R600 Localized edema: Secondary | ICD-10-CM

## 2022-06-23 DIAGNOSIS — R7303 Prediabetes: Secondary | ICD-10-CM

## 2022-06-23 DIAGNOSIS — F988 Other specified behavioral and emotional disorders with onset usually occurring in childhood and adolescence: Secondary | ICD-10-CM

## 2022-06-23 DIAGNOSIS — Z1211 Encounter for screening for malignant neoplasm of colon: Secondary | ICD-10-CM

## 2022-06-23 DIAGNOSIS — E782 Mixed hyperlipidemia: Secondary | ICD-10-CM

## 2022-06-23 NOTE — Progress Notes (Signed)
Established patient visit   Patient: Deanna Simpson   DOB: 12/25/1965   56 y.o. Female  MRN: [id number] Visit Date: 06/23/2022   Chief Complaint  Patient presents with   Follow-up   Subjective    HPI  Follow up  -lower extremity edema  --added HCTZ 12.5 mg daily  -recently had labs done through [facility]  --lipid panel was elevated.  --specifically, LDL and total cholesterol were elevated. Triglycerides mildly elevated  ---glucose was elevated at 134 with HgbA1c 6.3 Labs were done April 14, 2022 and specific results are as follows: -Total cholesterol is 224 -HDL is 53 -LDL is 163 -Triglyceride level is 153 -Hemoglobin A1c is 6.1 -Creatinine is 0.880  -ALT is 19  -TSH is 2.6 -she has seen cardiology and nutritionist.  --cardiology plans to do calcium scoring CT scan for further evaluation  --she ws started on rosuvastatin 20 mg per cardiology  --she does have a strong family history of CAD and early MI due to CAD   --nutritionist has developed a plan for her to lose approximately 15 pounds by June.  -the patient has been on metformin in the past which caused her to have stomach pain and diarrhea   Patient states that she feels well. She denies chest pain, chest pressure, or shortness of breath. She denies headaches or visual disturbances. She denies abdominal pain, nausea, vomiting, or changes in bowel or bladder habits.     Medications: Outpatient Medications Prior to Visit  Medication Sig   Amphetamine ER (ADZENYS XR-ODT) 12.5 MG TBED Take by mouth.   ARIPiprazole (ABILIFY) 10 MG tablet Take 1 tablet (10 mg total) by mouth daily.   cholecalciferol (VITAMIN D) 1000 UNITS tablet Take 1,000 Units by mouth daily.   DULoxetine (CYMBALTA) 30 MG capsule TAKE 1 CAPSULE (30 MG TOTAL) BY MOUTH DAILY.   hydrochlorothiazide (HYDRODIURIL) 12.5 MG tablet TAKE 1/2 TO 1 TABLET BY MOUTH DAILY AS NEEDED.   lamoTRIgine (LAMICTAL) 200 MG tablet Take 1 tablet (200 mg total) by  mouth daily.   methocarbamol (ROBAXIN) 500 MG tablet Take by mouth.   Multiple Vitamin (MULTIVITAMIN ADULT PO) Take by mouth.   rOPINIRole (REQUIP) 1 MG tablet Take 1 mg by mouth at bedtime.   rosuvastatin (CRESTOR) 20 MG tablet Take 1 tablet (20 mg total) by mouth daily.   No facility-administered medications prior to visit.    Review of Systems  Constitutional:  Negative for activity change, appetite change, chills, fatigue and fever.  HENT:  Negative for congestion, postnasal drip, rhinorrhea, sinus pressure, sinus pain, sneezing and sore throat.   Eyes: Negative.   Respiratory:  Negative for cough, chest tightness, shortness of breath and wheezing.   Cardiovascular:  Negative for chest pain and palpitations.       Elevated cholesterol on recent labs done per GYN provider   Gastrointestinal:  Negative for abdominal pain, constipation, diarrhea, nausea and vomiting.  Endocrine: Negative for cold intolerance, heat intolerance, polydipsia and polyuria.       Recently had labs done through GYN provider showing that she was borderline diabetic  Genitourinary:  Negative for dyspareunia, dysuria, flank pain, frequency and urgency.  Musculoskeletal:  Negative for arthralgias, back pain and myalgias.  Skin:  Negative for rash.  Allergic/Immunologic: Negative for environmental allergies.  Neurological:  Negative for dizziness, weakness and headaches.  Hematological:  Negative for adenopathy.  Psychiatric/Behavioral:  The patient is not nervous/anxious.        Objective  Today's Vitals   06/23/22 1037  BP: 132/85  Pulse: 87  SpO2: 97%  Weight: 246 lb 12.8 oz (111.9 kg)  Height: 5\' 4"  (1.626 m)   Body mass index is 42.36 kg/m.  BP Readings from Last 3 Encounters:  06/23/22 132/85  06/01/22 (Abnormal) 140/88  05/10/22 (Abnormal) 140/84    Wt Readings from Last 3 Encounters:  06/23/22 246 lb 12.8 oz (111.9 kg)  06/01/22 247 lb 12.8 oz (112.4 kg)  05/10/22 249 lb 12.8 oz  (113.3 kg)    Physical Exam Vitals and nursing note reviewed.  Constitutional:      Appearance: Normal appearance. She is well-developed.  HENT:     Head: Normocephalic and atraumatic.     Nose: Nose normal.     Mouth/Throat:     Mouth: Mucous membranes are moist.     Pharynx: Oropharynx is clear.  Eyes:     Extraocular Movements: Extraocular movements intact.     Conjunctiva/sclera: Conjunctivae normal.     Pupils: Pupils are equal, round, and reactive to light.  Cardiovascular:     Rate and Rhythm: Normal rate and regular rhythm.     Pulses: Normal pulses.     Heart sounds: Normal heart sounds.  Pulmonary:     Effort: Pulmonary effort is normal.     Breath sounds: Normal breath sounds.  Abdominal:     Palpations: Abdomen is soft.  Musculoskeletal:        General: Normal range of motion.     Cervical back: Normal range of motion and neck supple.  Lymphadenopathy:     Cervical: No cervical adenopathy.  Skin:    General: Skin is warm and dry.     Capillary Refill: Capillary refill takes less than 2 seconds.  Neurological:     General: No focal deficit present.     Mental Status: She is alert and oriented to person, place, and time.  Psychiatric:        Mood and Affect: Mood normal.        Behavior: Behavior normal.        Thought Content: Thought content normal.        Judgment: Judgment normal.       Assessment & Plan    1. Prediabetes Recent HgbA1c was 6.2. she is now seeing nutritionist to help her control calorie intake and develop plan to lose 15 pounds by June 2024. Will recheck this at next in-office visit.   2. Mixed hyperlipidemia Recent labs done per cardiology show mild elevation of LDL and total cholesterol. She did have normal HDL/LDL ratio, showing average risk of developing CAD. Cardiology did start her on rosuvastatin 20 mg daily. She would like to have opportunity to reduce cholesterol through diet and exercise before starting on statin or other lipid  lowering medication. She is now seeing cardiology. Is scheduled to have gated CT calcium scoring test.   3. Lower extremity edema Improved. Continue to take HCTZ 12.5 mg daily.   4. Bipolar disorder, in partial remission, most recent episode mixed [facility]) May continue to take lamictal and abilify as previously prescribed. 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 Screening for colon cancer .refer to GI for screening colonoscopy   - Ambulatory referral to Gastroenterology   Problem List Items Addressed This Visit       Other  Hyperlipidemia   Relevant Medications   hydrochlorothiazide (HYDRODIURIL) 12.5 MG tablet   Attention deficit disorder   Bipolar disorder, in partial remission, most recent episode mixed (HCC)   Lower extremity edema   Prediabetes - Primary   Other Visit Diagnoses     Screening for colon cancer       Relevant Orders   Ambulatory referral to Gastroenterology        Return in about 6 months (around 12/23/2022) for health maintenance exam, mood.         Carlean Jews, NP  [facility] [phone] (phone) [phone] (fax)  [facility]

## 2022-06-29 ENCOUNTER — Ambulatory Visit: Payer: 59 | Admitting: Dietician

## 2022-07-06 ENCOUNTER — Encounter: Payer: Self-pay | Admitting: Neurology

## 2022-07-06 ENCOUNTER — Encounter: Payer: Self-pay | Admitting: Internal Medicine

## 2022-07-06 ENCOUNTER — Ambulatory Visit (INDEPENDENT_AMBULATORY_CARE_PROVIDER_SITE_OTHER): Payer: BLUE CROSS/BLUE SHIELD | Admitting: Neurology

## 2022-07-06 VITALS — BP 137/81 | HR 96 | Ht 64.0 in | Wt 252.0 lb

## 2022-07-06 DIAGNOSIS — Q283 Other malformations of cerebral vessels: Secondary | ICD-10-CM | POA: Diagnosis not present

## 2022-07-06 DIAGNOSIS — G629 Polyneuropathy, unspecified: Secondary | ICD-10-CM | POA: Diagnosis not present

## 2022-07-06 DIAGNOSIS — R41840 Attention and concentration deficit: Secondary | ICD-10-CM | POA: Diagnosis not present

## 2022-07-06 MED ORDER — ROPINIROLE HCL 1 MG PO TABS: 1.0000 mg | ORAL_TABLET | Freq: Every day | ORAL | 1 refills | Status: DC

## 2022-07-06 MED ORDER — DULOXETINE HCL 60 MG PO CPEP: 60.0000 mg | ORAL_CAPSULE | Freq: Every day | ORAL | 3 refills | Status: AC
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
## 2022-07-06 NOTE — Patient Instructions (Addendum)
Increase duloxetine to 60 mg daily Will check Vitamin B12 level today Continue your other medications Refill for Requip given Discussed with your nutritionist option of starting GLP-1 RA for weight loss Follow-up in 6 months or sooner if worse.  At next follow-up we will discuss repeating MRA head

## 2022-07-07 LAB — VITAMIN B12: Vitamin B-12: 347 pg/mL (ref 232–1245)

## 2022-07-07 MED ORDER — VITAMIN B-12 1000 MCG PO TABS: 1000.0000 ug | ORAL_TABLET | Freq: Every day | ORAL | 1 refills | Status: AC

## 2022-07-07 NOTE — Addendum Note (Signed)
Addended byAlric Ran on: 07/07/2022 07:28 AM   Modules accepted: Orders

## 2022-07-16 ENCOUNTER — Ambulatory Visit (AMBULATORY_SURGERY_CENTER): Payer: Self-pay | Admitting: *Deleted

## 2022-07-16 VITALS — Ht 64.0 in | Wt 243.0 lb

## 2022-07-16 DIAGNOSIS — Z1211 Encounter for screening for malignant neoplasm of colon: Secondary | ICD-10-CM

## 2022-07-16 MED ORDER — NA SULFATE-K SULFATE-MG SULF 17.5-3.13-1.6 GM/177ML PO SOLN: 1.0000 | Freq: Once | ORAL | 0 refills | Status: AC

## 2022-07-16 NOTE — Progress Notes (Signed)
No egg or soy allergy known to patient  No issues known to pt with past sedation with any surgeries or procedures Patient denies ever being told they had issues or difficulty with intubation  No FH of Malignant Hyperthermia Pt is not on diet pills Pt is not on  home 02  Pt is not on blood thinners  Pt denies issues with constipation  No A fib or A flutter Have any cardiac testing pending--no Pt instructed to use Single[employer] or GoodRx for a price reduction on prep  Patient's chart reviewed by Deanna Simpson CNRA prior to previsit and patient appropriate for the [facility].  Previsit completed and red dot placed by patient's name on their procedure day (on provider's schedule).    

## 2022-07-22 ENCOUNTER — Ambulatory Visit (HOSPITAL_BASED_OUTPATIENT_CLINIC_OR_DEPARTMENT_OTHER)
Admission: RE | Admit: 2022-07-22 | Discharge: 2022-07-22 | Disposition: A | Discharge status: Home / Self Care | Payer: 59 | Source: Ambulatory Visit | Attending: Cardiovascular Disease | Admitting: Cardiovascular Disease

## 2022-07-22 DIAGNOSIS — I447 Left bundle-branch block, unspecified: Secondary | ICD-10-CM

## 2022-07-22 DIAGNOSIS — E782 Mixed hyperlipidemia: Secondary | ICD-10-CM | POA: Insufficient documentation

## 2022-07-22 DIAGNOSIS — R030 Elevated blood-pressure reading, without diagnosis of hypertension: Secondary | ICD-10-CM

## 2022-07-30 ENCOUNTER — Telehealth: Payer: Self-pay

## 2022-07-30 NOTE — Telephone Encounter (Signed)
Pt called for a referral for Colonoscopy to be sent to [place].

## 2022-08-05 NOTE — Telephone Encounter (Signed)
Contacted pt an informed her that she could contact [place] [facility] in [place] to schedule this appointment. They said no referral was needed as long as it was screening.Deanna Simpson, CMA

## 2022-08-05 NOTE — Telephone Encounter (Signed)
LVM for pt to call office to ask which [place] office she wanted me to send this to.  Uriel Dowding Zimmerman Rumple, CMA

## 2022-08-16 ENCOUNTER — Encounter: Payer: BLUE CROSS/BLUE SHIELD | Admitting: Internal Medicine

## 2022-08-18 ENCOUNTER — Ambulatory Visit: Payer: 59 | Admitting: Dietician

## 2022-08-31 ENCOUNTER — Other Ambulatory Visit: Payer: 59

## 2022-09-13 ENCOUNTER — Other Ambulatory Visit: Payer: Self-pay | Admitting: Nurse Practitioner

## 2022-09-13 DIAGNOSIS — F3177 Bipolar disorder, in partial remission, most recent episode mixed: Secondary | ICD-10-CM

## 2022-11-17 ENCOUNTER — Ambulatory Visit: Payer: BLUE CROSS/BLUE SHIELD | Admitting: Podiatry

## 2022-11-24 ENCOUNTER — Ambulatory Visit: Payer: BLUE CROSS/BLUE SHIELD | Admitting: Podiatry

## 2022-12-01 ENCOUNTER — Telehealth: Payer: Self-pay | Admitting: Neurology

## 2022-12-01 NOTE — Telephone Encounter (Signed)
Pt states due to her new insurance Dr Teresa Coombs is not covered.  Pt is asking for a referral to [facility] in [place] ph [phone]

## 2022-12-02 ENCOUNTER — Other Ambulatory Visit: Payer: Self-pay | Admitting: Neurology

## 2022-12-02 DIAGNOSIS — Q283 Other malformations of cerebral vessels: Secondary | ICD-10-CM

## 2022-12-02 DIAGNOSIS — M5416 Radiculopathy, lumbar region: Secondary | ICD-10-CM

## 2022-12-02 DIAGNOSIS — G629 Polyneuropathy, unspecified: Secondary | ICD-10-CM

## 2022-12-02 DIAGNOSIS — R569 Unspecified convulsions: Secondary | ICD-10-CM

## 2022-12-02 NOTE — Telephone Encounter (Signed)
New referral ordered

## 2022-12-03 ENCOUNTER — Telehealth: Payer: Self-pay | Admitting: Neurology

## 2022-12-03 NOTE — Telephone Encounter (Signed)
Referral faxed to [facility]: Phone: [phone]  Fax: [phone]

## 2022-12-07 NOTE — Progress Notes (Deleted)
Cardiology Office Note:    Date:  12/07/2022   ID:  Deanna Simpson, DOB 1966/05/08, MRN [id number]  PCP:  Carlean Jews, NP   [facility] Providers Cardiologist:  Bram Hottel   Referring MD: Carlean Jews, NP   No chief complaint on file.   History of Present Illness:    Deanna Simpson is a 57 y.o. female with a hx of healthy 57 year old female.  She has had some recent foot surgery ( rupture of achiles tendon ) and is on Xarelto for DVT prophylaxis.  She has a strong family history of cardiac disease and wanted to be seen for restratification.  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  She has had 2 separate repairs.  Has had syncope in the past (? Possible orthostasis)    She had a normal Lexiscan Myoview study in May, 2022.  She did have mild breast attenuation artifact.  She has normal left ventricular systolic function with an EF of 62% at rest and 69% with stress.    Echocardiogram reveals normal LVEF of 60 to 65%.  She has grade 1 diastolic dysfunction.  RV is normal in size and function.  Negative bubble study.  Labs from Dr. Gaye Alken office from April 14, 2022 Total cholesterol is 224 HDL is 53 LDL is 163 Triglyceride level is 153 Hemoglobin A1c is 6.1 Creatinine is 0.880  ALT is 19  TSH is 2.6  Blood pressure remains a little elevated.  She admits to eating more salt than she should.  December 08, 2022  Deanna Simpson is seen for follow up of her strong famiy hx of CAD     Past Medical History:  Diagnosis Date   Achilles tendon rupture, right, initial encounter    Allergy    Anxiety    Complication of anesthesia    Histerics   Depression    Hyperlipemia    Interstitial cystitis    LBBB (left bundle branch block)    workup in [place] in Care everywhere     Past Surgical History:  Procedure Laterality Date   ACHILLES TENDON SURGERY Right 11/17/2021   Procedure: RIGHT ACHILLES TENDON RECONSTRUCTION;  Surgeon: Toni Arthurs, MD;  Location: [facility];  Service: Orthopedics;  Laterality: Right;   ACHILLES TENDON SURGERY Right 02/04/2022   Procedure: ACHILLES TENDON RECONSTRUCTION;  Surgeon: Toni Arthurs, MD;  Location: [facility];  Service: Orthopedics;  Laterality: Right;   BREAST SURGERY     reduction,2003   BUNIONECTOMY Bilateral    GASTROCNEMIUS RECESSION Right 11/17/2021   Procedure: RIGHT GASTROCNEMIUS RECESSION;  Surgeon: Toni Arthurs, MD;  Location: [facility];  Service: Orthopedics;  Laterality: Right;   ORIF CALCANEOUS FRACTURE Right 11/17/2021   Procedure: right open treatment of calcaneus fracture;  Surgeon: Toni Arthurs, MD;  Location: [facility];  Service: Orthopedics;  Laterality: Right;    TUBAL LIGATION      Current Medications: No outpatient medications have been marked as taking for the 12/08/22 encounter (Appointment) with Kindall Swaby, Deloris Ping, MD.     Allergies:   Antihistamines, loratadine-type; Influenza vaccine live; and Pneumococcal vaccines   Social History   Socioeconomic History   Marital status: Married  Spouse name: Not on file   Number of children: 3   Years of education: Not on file   Highest education level: Not on file  Occupational History   Occupation: TEACHER    Employer: [employer]  Tobacco Use   Smoking status: Never   Smokeless tobacco: Never  Vaping Use   Vaping Use: Never used  Substance and Sexual Activity   Alcohol use: No   Drug use: No   Sexual activity: Yes    Birth control/protection: Surgical, Post-menopausal    Comment: BTL  Other Topics Concern   Not on file  Social History Narrative   Not on file   Social Determinants of Health   Financial Resource Strain: Not on file  Food Insecurity: Not on  file  Transportation Needs: Not on file  Physical Activity: Not on file  Stress: Not on file  Social Connections: Not on file     Family History: The patient's family history includes Heart disease in her brother, maternal grandfather, maternal grandmother, and mother; Hyperlipidemia in her father. There is no history of Colon cancer, Colon polyps, Crohn's disease, Esophageal cancer, Rectal cancer, Stomach cancer, or Ulcerative colitis.  ROS:   Please see the history of present illness.     All other systems reviewed and are negative.  EKGs/Labs/Other Studies Reviewed:    The following studies were reviewed today:   EKG:   Recent Labs: No results found for requested labs within last 365 days.  Recent Lipid Panel No results found for: "CHOL", "TRIG", "HDL", "CHOLHDL", "VLDL", "LDLCALC", "LDLDIRECT"   Risk Assessment/Calculations:      No BP recorded.  {Refresh Note OR Click here to enter BP  :1}***         Physical Exam:    Physical Exam: Last menstrual period 07/29/2013.  No BP recorded.  {Refresh Note OR Click here to enter BP  :1}***    GEN:  Well nourished, well developed in no acute distress HEENT: Normal NECK: No JVD; No carotid bruits LYMPHATICS: No lymphadenopathy CARDIAC: RRR ***, no murmurs, rubs, gallops RESPIRATORY:  Clear to auscultation without rales, wheezing or rhonchi  ABDOMEN: Soft, non-tender, non-distended MUSCULOSKELETAL:  No edema; No deformity  SKIN: Warm and dry NEUROLOGIC:  Alert and oriented x 3    ASSESSMENT:    No diagnosis found.  PLAN:       Family history of coronary artery disease:  Excessive high cholesterol.     2.  Hypertension:    3. Morbid obesity:            Medication Adjustments/Labs and Tests Ordered: Current medicines are reviewed at length with the patient today.  Concerns regarding medicines are outlined above.  No orders of the defined types were placed in this encounter.  No orders of the  defined types were placed in this encounter.   There are no Patient Instructions on file for this visit.   Signed, Kristeen Miss, MD  12/07/2022 11:28 AM    [facility]

## 2022-12-08 ENCOUNTER — Ambulatory Visit: Payer: BLUE CROSS/BLUE SHIELD | Attending: Cardiovascular Disease | Admitting: Cardiovascular Disease

## 2022-12-08 ENCOUNTER — Encounter: Payer: Self-pay | Admitting: Cardiovascular Disease

## 2022-12-10 ENCOUNTER — Other Ambulatory Visit: Payer: Self-pay | Admitting: Nurse Practitioner

## 2022-12-10 DIAGNOSIS — F3177 Bipolar disorder, in partial remission, most recent episode mixed: Secondary | ICD-10-CM

## 2022-12-28 ENCOUNTER — Encounter: Payer: 59 | Admitting: Nurse Practitioner

## 2022-12-28 ENCOUNTER — Encounter: Payer: 59 | Admitting: Family Medicine

## 2023-01-05 ENCOUNTER — Ambulatory Visit: Payer: Self-pay | Admitting: Neurology

## 2023-04-25 ENCOUNTER — Other Ambulatory Visit: Payer: Self-pay | Admitting: Family Medicine

## 2023-04-25 ENCOUNTER — Other Ambulatory Visit (HOSPITAL_BASED_OUTPATIENT_CLINIC_OR_DEPARTMENT_OTHER): Payer: Self-pay

## 2023-04-25 ENCOUNTER — Other Ambulatory Visit: Payer: Self-pay | Admitting: Nurse Practitioner

## 2023-04-25 DIAGNOSIS — F3177 Bipolar disorder, in partial remission, most recent episode mixed: Secondary | ICD-10-CM

## 2023-04-25 MED ORDER — AMPHETAMINE-DEXTROAMPHET ER 20 MG PO CP24
20.0000 mg | ORAL_CAPSULE | Freq: Two times a day (BID) | ORAL | 0 refills | Status: AC
  Filled 2023-04-25: qty 60, 30d supply, fill #0

## 2023-04-28 ENCOUNTER — Other Ambulatory Visit (HOSPITAL_BASED_OUTPATIENT_CLINIC_OR_DEPARTMENT_OTHER): Payer: Self-pay

## 2023-09-08 ENCOUNTER — Other Ambulatory Visit: Payer: Self-pay | Admitting: Neurology

## 2024-03-15 IMAGING — MR MR ANKLE*R* W/O CM
5 series · 40 of 40 positions shown · non-contrast
Comparison: Plain films right foot 10/19/2021.

CLINICAL DATA: Onset right ankle pain the patient suffered an
injury jumping rope 11/05/2021.

EXAM:
MRI OF THE RIGHT ANKLE WITHOUT CONTRAST
TECHNIQUE: Multiplanar, multisequence MR imaging of the ankle was performed. No
intravenous contrast was administered.

[Series 3: T2 fat-sat · axial · 3.0mm · 0.70mm/px · z∈[-150,-29]mm · 9 of 38 slices shown (1 of 2)]
[im 1/38]
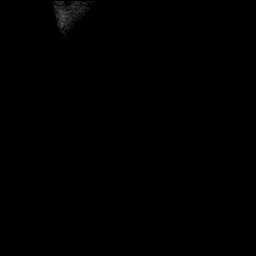
[im 5/38]
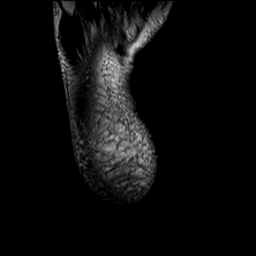
[im 10/38]
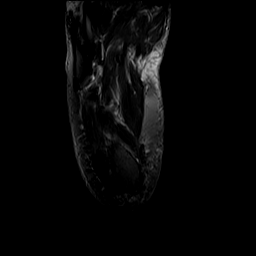
[im 14/38]
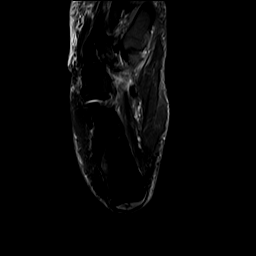
[im 19/38]
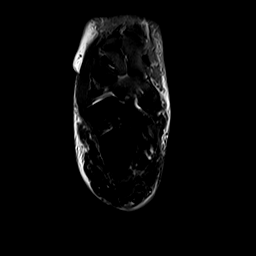
[im 24/38]
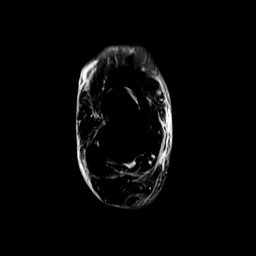
[im 28/38]
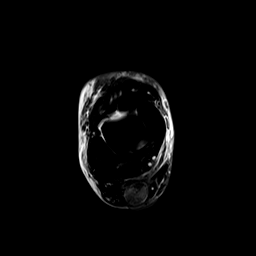
[im 33/38]
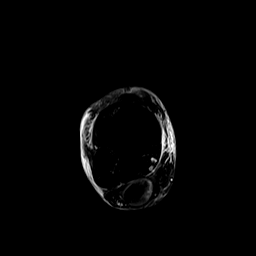
[im 38/38]
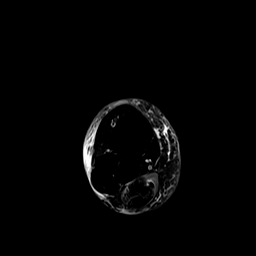

[Series 4: T2 fat-sat · coronal · 3.0mm · 0.70mm/px · 9 of 43 slices shown (2 of 2)]
[im 1/43]
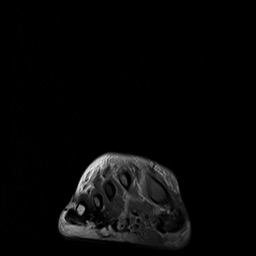
[im 6/43]
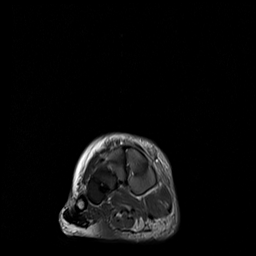
[im 11/43]
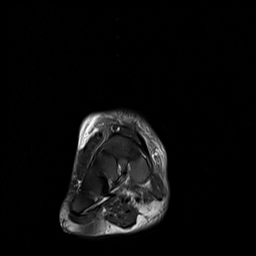
[im 16/43]
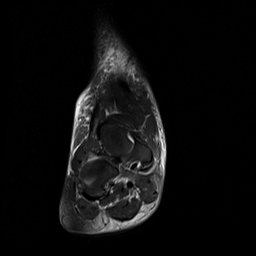
[im 22/43]
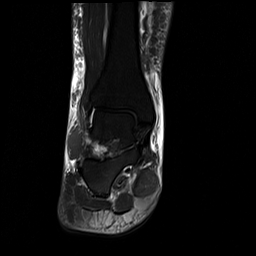
[im 27/43]
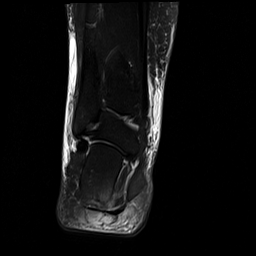
[im 32/43]
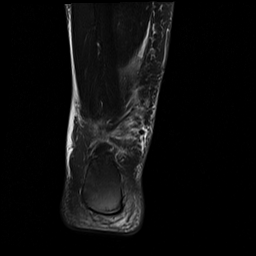
[im 37/43]
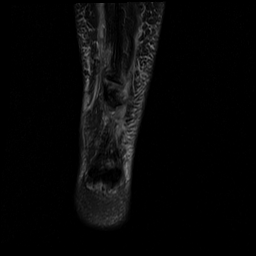
[im 43/43]
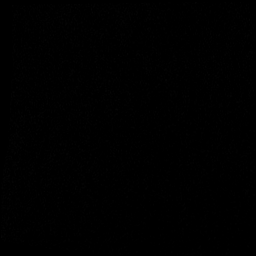

[Series 5: T1 · sagittal · 3.0mm · 0.59mm/px · 7 of 32 slices shown]
[im 1/32]
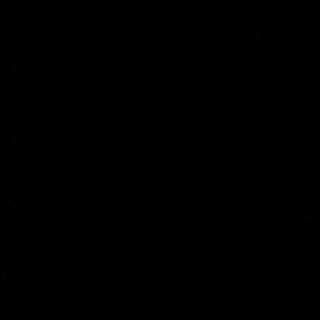
[im 6/32]
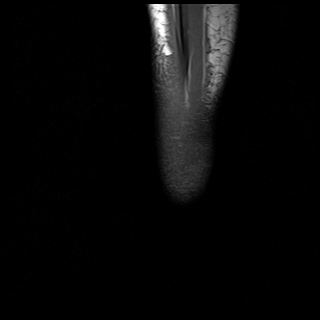
[im 11/32]
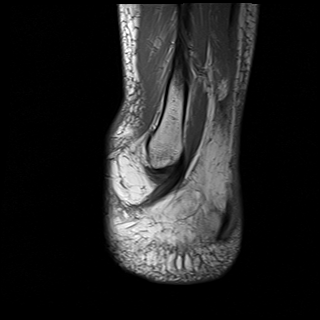
[im 16/32]
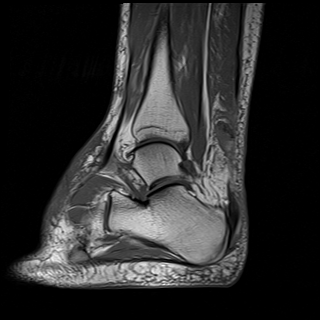
[im 21/32]
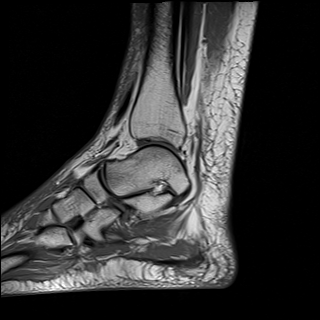
[im 26/32]
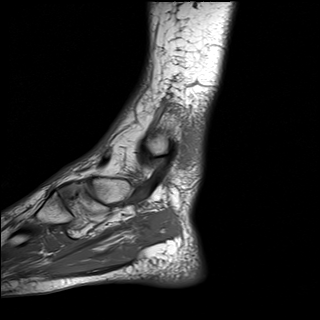
[im 32/32]
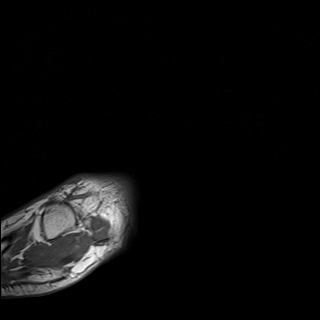

[Series 6: STIR · sagittal · 3.0mm · 0.74mm/px · 7 of 32 slices shown]
[im 1/32]
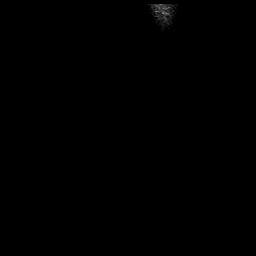
[im 6/32]
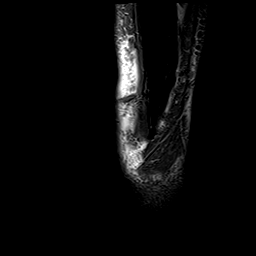
[im 11/32]
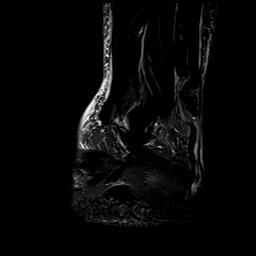
[im 16/32]
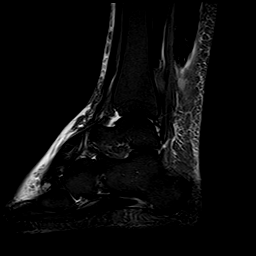
[im 21/32]
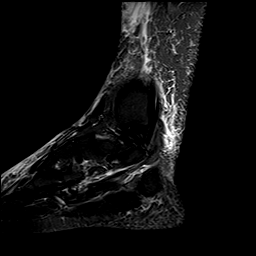
[im 26/32]
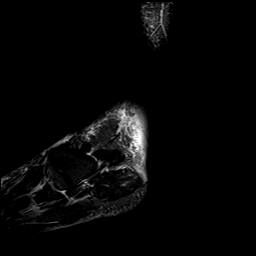
[im 32/32]
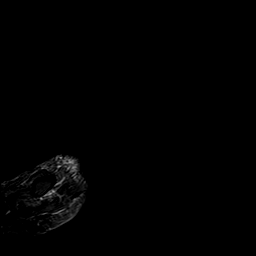

[Series 7: PD · axial · 3.0mm · 0.70mm/px · z∈[-150,-29]mm · 8 of 38 slices shown]
[im 1/38]
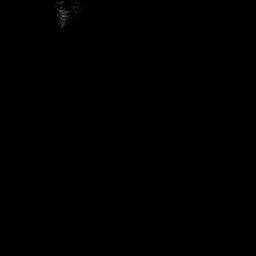
[im 6/38]
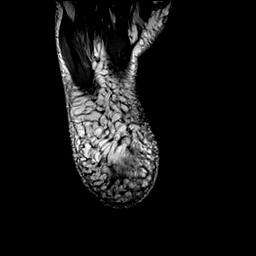
[im 11/38]
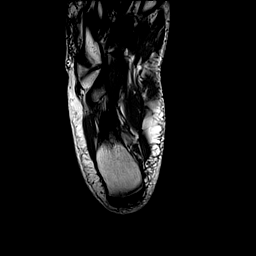
[im 16/38]
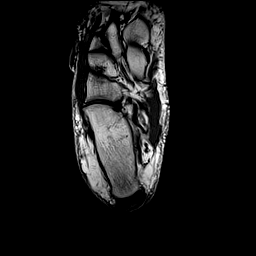
[im 22/38]
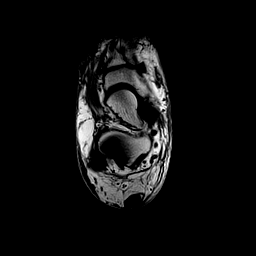
[im 27/38]
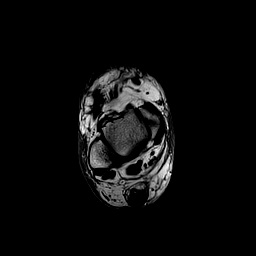
[im 32/38]
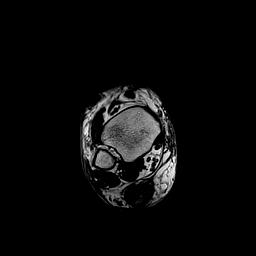
[im 38/38]
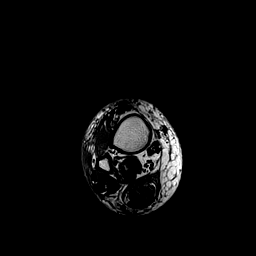

[40 of 40 positions shown; findings below may reference images not displayed]

FINDINGS: TENDONS

Peroneal: Intact.

Posteromedial: Intact.

Anterior: Intact.

Achilles: Completely torn. The tear is oblique in orientation from
lateral to medial. It originates approximately 4 cm above its
insertion laterally and 2.5 cm above its insertion medially. Gap
between tendon fragments ranges from 1-4 cm.

Plantar Fascia: Intact.  No evidence of plantar fasciitis.

LIGAMENTS

Lateral: Intact

Medial: Intact.

CARTILAGE

Ankle Joint: Negative. No osteochondral lesion of the talar dome or
joint effusion.

Subtalar Joints/Sinus Tarsi: Normal.

Bones: Negative.

Other: Subcutaneous edema is present about the ankle.
IMPRESSION: Complete obliquely oriented Achilles tendon tear as described. The
exam is otherwise negative.
